# Patient Record
Sex: Male | Born: 1975 | Hispanic: Yes | Marital: Married | Smoking: Never smoker
Health system: Southern US, Community
[De-identification: ages and names within clinical notes are randomized; demographics above are authoritative.]

## PROBLEM LIST (undated history)

## (undated) DIAGNOSIS — Z789 Other specified health status: Secondary | ICD-10-CM

## (undated) HISTORY — PX: EYE SURGERY: SHX253

## (undated) HISTORY — PX: APPENDECTOMY: SHX54

---

## 2012-06-22 ENCOUNTER — Ambulatory Visit: Payer: Self-pay | Admitting: Family Medicine

## 2012-06-22 ENCOUNTER — Ambulatory Visit: Payer: Self-pay

## 2012-06-22 VITALS — BP 98/70 | HR 90 | Temp 98.3°F | Resp 18 | Ht 66.0 in | Wt 174.0 lb

## 2012-06-22 DIAGNOSIS — R1032 Left lower quadrant pain: Secondary | ICD-10-CM

## 2012-06-22 DIAGNOSIS — F524 Premature ejaculation: Secondary | ICD-10-CM

## 2012-06-22 LAB — POCT URINALYSIS DIPSTICK
Bilirubin, UA: NEGATIVE
Glucose, UA: NEGATIVE
Ketones, UA: NEGATIVE
Leukocytes, UA: NEGATIVE
Nitrite, UA: NEGATIVE
Protein, UA: NEGATIVE
Spec Grav, UA: 1.025
Urobilinogen, UA: 0.2
pH, UA: 5.5

## 2012-06-22 MED ORDER — SERTRALINE HCL 50 MG PO TABS: 50.0000 mg | ORAL_TABLET | Freq: Every day | ORAL | Status: DC

## 2012-06-22 MED ORDER — KETOPROFEN 50 MG PO CAPS: 50.0000 mg | ORAL_CAPSULE | Freq: Two times a day (BID) | ORAL | Status: DC | PRN

## 2012-06-22 NOTE — Progress Notes (Signed)
  Subjective:    Patient ID: Trevor Graves, male    DOB: 02/12/1976, 36 y.o.   MRN: [id number]  HPI Has experienced LLQ flank pain for 2 years. He has been to several different doctors where they did not do various tests. He experiences pain everyday, but it's not constant. He is fine during work, but after work he feels pain, expecially after taking off the work belt.  No family history of kidney stones  Work:  roofing  Review of Systems No fever, no dysuria, no emesis or nausea, no hematuria Positive for premature ejaculation    Objective:   Physical Exam  Chest:  Clear Heart: reg, no murmur Genitalia:  Normal with exception of small left testicle ROM of torso:  Normal, but he has pain with flexion, twisting or side bends Abdomen:  Normal palpation with exception of pain in LLQ, no masses or HSM    [facility] reading (PRIMARY) by  Dr. Milus Glazier:  KUB-. Results for orders placed in visit on 06/22/12  POCT URINALYSIS DIPSTICK      Component Value Range   Color, UA yellow     Clarity, UA clear     Glucose, UA neg     Bilirubin, UA neg     Ketones, UA neg     Spec Grav, UA 1.025     Blood, UA trace     pH, UA 5.5     Protein, UA neg     Urobilinogen, UA 0.2     Nitrite, UA neg     Leukocytes, UA Negative       Assessment & Plan:  Premature ejaculation Chronic left flank pain, consistent with abdominal oblique strain  1. LLQ pain  DG Abd 1 View, Comprehensive metabolic panel, POCT urinalysis dipstick, ketoprofen (ORUDIS) 50 MG capsule  2. Premature ejaculation  sertraline (ZOLOFT) 50 MG tablet

## 2012-06-22 NOTE — Patient Instructions (Addendum)
Distensión muscular  (Muscle Strain)   Un esguince muscular se produce cuando un músculo se estira más allá de su largo normal. Generalmente causa la ruptura de algunas fibras musculares. Es muy frecuente entre los atletas. Ocurre cuando se aplica una fuerza violenta y súbita sobre un músculo y éste se estira demasiado. En general, la recuperación de un esguince muscular demora entre 1 y 2 semanas. La curación completa se produce luego de 5 a 6 semanas.   INSTRUCCIONES PARA EL CUIDADO DOMICILIARIO  · Mientras se encuentre despierto, aplique hielo sobre el músculo dolorido durante los primeros 2 días luego de la lesión.  · Ponga el hielo en una bolsa plástica.  · Colóquese una toalla entre la piel y la bolsa de hielo.  · Deje el hielo durante 15 a 20 minutos por hora.  · No use el músculo que sufrió el esguince durante algunos días, hasta que no sienta más dolor.  · Puede vendarse la zona lesionada con una venda elástica para obtener mayor comodidad. Tenga cuidado de no ajustarla demasiado. Esto puede interferir con la circulación sanguínea o aumentar la hinchazón.  · Sólo tome medicamentos de venta libre o recetados para calmar el dolor, las molestias o bajar la fiebre según las indicaciones de su médico.  SOLICITE ATENCIÓN MÉDICA SI:  El dolor o la hinchazón en la zona afectada aumentan.  ESTÉ SEGURO QUE:   · Comprende las instrucciones para el alta médica.  · Controlará su enfermedad.  · Solicitará atención médica de inmediato según las indicaciones.  Document Released: 06/12/2005 Document Revised: 11/25/2011  [employer]® Patient Information ©2013 [employer].

## 2012-06-23 LAB — COMPREHENSIVE METABOLIC PANEL
ALT: 25 U/L (ref 0–53)
AST: 15 U/L (ref 0–37)
Albumin: 4.9 g/dL (ref 3.5–5.2)
Alkaline Phosphatase: 57 U/L (ref 39–117)
BUN: 11 mg/dL (ref 6–23)
CO2: 23 mEq/L (ref 19–32)
Calcium: 9.4 mg/dL (ref 8.4–10.5)
Chloride: 104 mEq/L (ref 96–112)
Creat: 0.93 mg/dL (ref 0.50–1.35)
Glucose, Bld: 83 mg/dL (ref 70–99)
Potassium: 4 mEq/L (ref 3.5–5.3)
Sodium: 138 mEq/L (ref 135–145)
Total Bilirubin: 0.6 mg/dL (ref 0.3–1.2)
Total Protein: 7.1 g/dL (ref 6.0–8.3)

## 2012-12-21 ENCOUNTER — Other Ambulatory Visit: Payer: Self-pay

## 2012-12-21 ENCOUNTER — Inpatient Hospital Stay (HOSPITAL_COMMUNITY)
Admission: EM | Admit: 2012-12-21 | Discharge: 2012-12-24 | DRG: 343 | Disposition: A | Discharge status: Home / Self Care | Payer: Medicaid Other | Attending: General Surgery | Admitting: General Surgery

## 2012-12-21 ENCOUNTER — Encounter (HOSPITAL_COMMUNITY): Admission: EM | Disposition: A | Discharge status: Home / Self Care | Payer: Self-pay | Source: Home / Self Care

## 2012-12-21 ENCOUNTER — Telehealth: Payer: Self-pay | Admitting: Radiology

## 2012-12-21 ENCOUNTER — Encounter (HOSPITAL_COMMUNITY): Payer: Self-pay | Admitting: Certified Registered"

## 2012-12-21 ENCOUNTER — Encounter (HOSPITAL_COMMUNITY): Payer: Self-pay | Admitting: *Deleted

## 2012-12-21 ENCOUNTER — Inpatient Hospital Stay (HOSPITAL_COMMUNITY): Payer: Medicaid Other | Admitting: Certified Registered"

## 2012-12-21 ENCOUNTER — Ambulatory Visit: Payer: Self-pay | Admitting: Family Medicine

## 2012-12-21 ENCOUNTER — Ambulatory Visit (HOSPITAL_COMMUNITY)
Admission: RE | Admit: 2012-12-21 | Discharge: 2012-12-21 | Disposition: A | Discharge status: Home / Self Care | Payer: Medicaid Other | Source: Ambulatory Visit | Attending: Family Medicine | Admitting: Family Medicine

## 2012-12-21 VITALS — BP 130/86 | HR 84 | Temp 98.3°F | Resp 16 | Ht 67.58 in | Wt 187.6 lb

## 2012-12-21 DIAGNOSIS — R1031 Right lower quadrant pain: Secondary | ICD-10-CM | POA: Insufficient documentation

## 2012-12-21 DIAGNOSIS — K3580 Unspecified acute appendicitis: Secondary | ICD-10-CM | POA: Insufficient documentation

## 2012-12-21 DIAGNOSIS — D72829 Elevated white blood cell count, unspecified: Secondary | ICD-10-CM | POA: Diagnosis present

## 2012-12-21 DIAGNOSIS — K3532 Acute appendicitis with perforation and localized peritonitis, without abscess: Secondary | ICD-10-CM

## 2012-12-21 HISTORY — DX: Other specified health status: Z78.9

## 2012-12-21 LAB — CBC
HCT: 44 % (ref 39.0–52.0)
Hemoglobin: 15.4 g/dL (ref 13.0–17.0)
MCH: 30.1 pg (ref 26.0–34.0)
MCHC: 35 g/dL (ref 30.0–36.0)
MCV: 85.9 fL (ref 78.0–100.0)
Platelets: 196 10*3/uL (ref 150–400)
RBC: 5.12 MIL/uL (ref 4.22–5.81)
RDW: 12.3 % (ref 11.5–15.5)
WBC: 12.6 10*3/uL — ABNORMAL HIGH (ref 4.0–10.5)

## 2012-12-21 LAB — BASIC METABOLIC PANEL
BUN: 8 mg/dL (ref 6–23)
CO2: 28 mEq/L (ref 19–32)
Calcium: 9.5 mg/dL (ref 8.4–10.5)
Chloride: 97 mEq/L (ref 96–112)
Creatinine, Ser: 0.86 mg/dL (ref 0.50–1.35)
GFR calc Af Amer: >90 mL/min (ref >90)
GFR calc non Af Amer: >90 mL/min (ref >90)
Glucose, Bld: 108 mg/dL — ABNORMAL HIGH (ref 70–99)
Potassium: 3.9 mEq/L (ref 3.5–5.1)
Sodium: 135 mEq/L (ref 135–145)

## 2012-12-21 LAB — POCT CBC
Hemoglobin: 15.1 g/dL (ref 14.1–18.1)
MCH, POC: 29.9 pg (ref 27–31.2)
MID (cbc): 0.7 (ref 0–0.9)
MPV: 7.7 fL (ref 0–99.8)
POC Granulocyte: 9.9 — AB (ref 2–6.9)
POC MID %: 6 %M (ref 0–12)
Platelet Count, POC: 256 10*3/uL (ref 142–424)
RBC: 5.05 M/uL (ref 4.69–6.13)

## 2012-12-21 SURGERY — APPENDECTOMY, LAPAROSCOPIC
Anesthesia: General

## 2012-12-21 MED ORDER — ONDANSETRON HCL 4 MG/2ML IJ SOLN
4.0000 mg | Freq: Once | INTRAMUSCULAR | Status: AC
  Administered 2012-12-21: 4 mg via INTRAVENOUS
  Filled 2012-12-21: qty 2

## 2012-12-21 MED ORDER — IOHEXOL 300 MG/ML  SOLN
50.0000 mL | Freq: Once | INTRAMUSCULAR | Status: AC | PRN
  Administered 2012-12-21: 50 mL via ORAL

## 2012-12-21 MED ORDER — SODIUM CHLORIDE 0.9 % IV BOLUS (SEPSIS)
1000.0000 mL | Freq: Once | INTRAVENOUS | Status: AC
  Administered 2012-12-21: 1000 mL via INTRAVENOUS

## 2012-12-21 MED ORDER — MORPHINE SULFATE 2 MG/ML IJ SOLN: 1.0000 mg | INTRAMUSCULAR | Status: DC | PRN

## 2012-12-21 MED ORDER — IOHEXOL 300 MG/ML  SOLN
100.0000 mL | Freq: Once | INTRAMUSCULAR | Status: AC | PRN
  Administered 2012-12-21: 100 mL via INTRAVENOUS

## 2012-12-21 MED ORDER — HYDROMORPHONE HCL PF 1 MG/ML IJ SOLN
1.0000 mg | Freq: Once | INTRAMUSCULAR | Status: AC
  Administered 2012-12-21: 1 mg via INTRAVENOUS
  Filled 2012-12-21: qty 1

## 2012-12-21 MED ORDER — LACTATED RINGERS IV SOLN
INTRAVENOUS | Status: DC
  Administered 2012-12-21: 22:00:00 via INTRAVENOUS

## 2012-12-21 MED ORDER — SODIUM CHLORIDE 0.9 % IV SOLN
1.0000 g | Freq: Once | INTRAVENOUS | Status: AC
  Administered 2012-12-21: 1 g via INTRAVENOUS
  Filled 2012-12-21: qty 1

## 2012-12-21 NOTE — ED Provider Notes (Signed)
History    37 year old male with abdominal pain. Gradual onset yesterday morning at approximately 0430. Was laying in bed when he began having pain in his epigastrium. Pain subsequently radiated to his umbilical region and then the right lower quadrant. This pain is constant. Worse with movement and palpation. Nausea. No v/d. No urinary complaints. No significant PMHx. No hx of abdominal surgery. Seen at [facility] and sent for CT. Referred to ED after had imaging significant for likely ruptured appendicitis.   CSN: [id number]  Arrival date & time 12/21/12  1914   First MD Initiated Contact with Patient 12/21/12 1958      Chief Complaint  Patient presents with  . Abdominal Pain    (Consider location/radiation/quality/duration/timing/severity/associated sxs/prior treatment) HPI  History reviewed. No pertinent past medical history.  Past Surgical History  Procedure Laterality Date  . Eye surgery      History reviewed. No pertinent family history.  History  Substance Use Topics  . Smoking status: Never Smoker   . Smokeless tobacco: Not on file  . Alcohol Use: No      Review of Systems  All systems reviewed and negative, other than as noted in HPI.   Allergies  Review of patient's allergies indicates no known allergies.  Home Medications  No current outpatient prescriptions on file.  BP 133/83  Pulse 101  Temp(Src) 98.4 F (36.9 C) (Oral)  Resp 18  SpO2 98%  Physical Exam  Nursing note and vitals reviewed. Constitutional: He appears well-developed and well-nourished. No distress.  HENT:  Head: Normocephalic and atraumatic.  Eyes: Conjunctivae are normal. Right eye exhibits no discharge. Left eye exhibits no discharge.  Neck: Neck supple.  Cardiovascular: Normal rate, regular rhythm and normal heart sounds.  Exam reveals no gallop and no friction rub.   No murmur heard. Pulmonary/Chest: Effort normal and breath sounds normal. No respiratory distress.  Abdominal:  Soft. He exhibits no distension. There is tenderness. There is guarding. There is no rebound.  Right lower quadrant tenderness with voluntary guarding. No rebound tenderness. No distention. No surgical scars noted.  Genitourinary:  No costovertebral angle tenderness.  Musculoskeletal: He exhibits no edema and no tenderness.  Neurological: He is alert.  Skin: Skin is warm and dry.  Psychiatric: He has a normal mood and affect. His behavior is normal. Thought content normal.    ED Course  Procedures (including critical care time)  Labs Reviewed  CBC - Abnormal; Notable for the following:    WBC 12.6 (*)    All other components within normal limits  BASIC METABOLIC PANEL   Ct Abdomen Pelvis W Contrast  12/21/2012  *RADIOLOGY REPORT*  Clinical Data: Worsening right lower quadrant abdominal pain.  CT ABDOMEN AND PELVIS WITH CONTRAST  Technique:  Multidetector CT imaging of the abdomen and pelvis was performed following the standard protocol during bolus administration of intravenous contrast.  Contrast: OMNIPAQUE IOHEXOL 300 MG/ML  SOLN, 50mL OMNIPAQUE IOHEXOL 300 MG/ML  SOLN  Comparison: None.  Findings: There is evidence of ruptured acute appendicitis with significant inflammatory changes present inferior to the cecum. Visualized proximal to mid appendix is thickened, measuring 12 mm in greatest diameter.  The distal appendix is lost in an inflammatory mass with stranding and small areas of fluid present. No dominant drainable abscess is identified.  There is no evidence of visible appendicolith.  No associated bowel obstruction.  The liver, gallbladder, pancreas, adrenal glands, spleen and kidneys are within normal limits.  No hernias are seen.  Bladder  is unremarkable.  No bony abnormalities.  IMPRESSION: Ruptured appendicitis with significant inflammatory changes and irregular fluid surrounding a distended appendix.  No focal discrete abscess is identified.   Original Report Authenticated  By: Irish Lack, M.D.      1. Ruptured appendicitis       MDM  37 year old male with abdominal pain. Imaging significant for appendicitis. Basic labs ordered. Antibiotics. Discussed with general surgery. Admission.        Raeford Razor, MD 12/21/12 2031

## 2012-12-21 NOTE — Telephone Encounter (Signed)
[facility] called again, about patient, he is still in radiology, have advised patient to go to [facility] emergency room, he has ruptured appendix. Liborio Nixon in radiology is trying to locate patient. I have advised her Dr Fredia Sorrow has called already, and he is going to locate patient for [place]. Patient may have been d/c already . Liborio Nixon will speak to Dr Fredia Sorrow, then locate patient. Emalie Mcwethy

## 2012-12-21 NOTE — Patient Instructions (Signed)
Please call the [name] office at [phone] after your surgery so that you can have help in paying for it since you do not have insurance.  Appendicitis Appendicitis is when the appendix is swollen (inflamed). The inflammation can lead to developing a hole (perforation) and a collection of pus (abscess). CAUSES  There is not always an obvious cause of appendicitis. Sometimes it is caused by an obstruction in the appendix. The obstruction can be caused by:  A small, hard, pea-sized ball of stool (fecalith).  Enlarged lymph glands in the appendix. SYMPTOMS   Pain around your belly button (navel) that moves toward your lower right belly (abdomen). The pain can become more severe and sharp as time passes.  Tenderness in the lower right abdomen. Pain gets worse if you cough or make a sudden movement.  Feeling sick to your stomach (nauseous).  Throwing up (vomiting).  Loss of appetite.  Fever.  Constipation.  Diarrhea.  Generally not feeling well. DIAGNOSIS   Physical exam.  Blood tests.  Urine test.  X-rays or a CT scan may confirm the diagnosis. TREATMENT  Once the diagnosis of appendicitis is made, the most common treatment is to remove the appendix as soon as possible. This procedure is called appendectomy. In an open appendectomy, a cut (incision) is made in the lower right abdomen and the appendix is removed. In a laparoscopic appendectomy, usually 3 small incisions are made. Long, thin instruments and a camera tube are used to remove the appendix. Most patients go home in 24 to 48 hours after appendectomy. In some situations, the appendix may have already perforated and an abscess may have formed. The abscess may have a "wall" around it as seen on a CT scan. In this case, a drain may be placed into the abscess to remove fluid, and you may be treated with antibiotic medicines that kill germs. The medicine is given through a tube in your vein (IV). Once  the abscess has resolved, it may or may not be necessary to have an appendectomy. You may need to stay in the hospital longer than 48 hours. Document Released: 09/02/2005 Document Revised: 03/03/2012 Document Reviewed: 11/28/2009 [facility] Patient Information 2013 [place], [place].

## 2012-12-21 NOTE — ED Notes (Addendum)
Talked with surgeon about patient's disposition to [facility]. Surgeon stated that he would like the patient to stay in the ED because there is just one patient ahead of the patient for surgery. Informed surgeon that patient had a bed available, however did not have any admission orders other than the admit order/bed request. Surgeon stated there is difficulty obtaining patients from the floor for surgery. No additional orders obtained.

## 2012-12-21 NOTE — ED Notes (Signed)
Report called to [facility]. Pt does not have temporary admit orders. Called Dr. Juleen China, who stated that he wanted to wait for surgery to evaluate patient prior to moving patient. Spoke with ED Charge nurse about situation in attempt to reduce pt time in department. ED Charge called house coverage who supported the EDP decision to keep patient in department.

## 2012-12-21 NOTE — Consult Note (Signed)
Reason for Consult:abdominal pain Referring Physician: [facility]  Trevor Graves is an 37 y.o. male.  HPI: I was asked to evaluate this patient for abdominal pain and possible ruptured appendicitis. He said that Saturday night when he went to bed he was feeling well without any complaints. He awoke Sunday morning about 4 am with epigastric and periumbilical pain. He said that this continued to get worse throughout the day and eventually migrated to the right lower quadrant. He says that the pain has been getting worse and he cannot lay down flat without exacerbating the pain. He is not anorexic but does have some chills. He has not had any nausea or vomiting today but yesterday did vomit. He denies any urinary symptoms. Denies any problems with his bowels  Past Medical History  Diagnosis Date  . Medical history non-contributory   None  Past Surgical History  Procedure Laterality Date  . Eye surgery    Lasik  History reviewed. No pertinent family history.  Social History:  reports that he has never smoked. He has never used smokeless tobacco. He reports that  drinks alcohol. He reports that he does not use illicit drugs.  Allergies: No Known Allergies  Medications: I have reviewed the patient's current medications.  Results for orders placed during the hospital encounter of 12/21/12 (from the past 48 hour(s))  BASIC METABOLIC PANEL     Status: Abnormal   Collection Time    12/21/12  8:03 PM      Result Value Range   Sodium 135  135 - 145 mEq/L   Potassium 3.9  3.5 - 5.1 mEq/L   Chloride 97  96 - 112 mEq/L   CO2 28  19 - 32 mEq/L   Glucose, Bld 108 (*) 70 - 99 mg/dL   BUN 8  6 - 23 mg/dL   Creatinine, Ser 9.60  0.50 - 1.35 mg/dL   Calcium 9.5  8.4 - 45.4 mg/dL   GFR calc non Af Amer >90  >90 mL/min   GFR calc Af Amer >90  >90 mL/min   Comment:            The eGFR has been calculated     using the CKD EPI equation.     This calculation has not been     validated in all  clinical     situations.     eGFR's persistently     <90 mL/min signify     possible Chronic Kidney Disease.  CBC     Status: Abnormal   Collection Time    12/21/12  8:03 PM      Result Value Range   WBC 12.6 (*) 4.0 - 10.5 K/uL   RBC 5.12  4.22 - 5.81 MIL/uL   Hemoglobin 15.4  13.0 - 17.0 g/dL   HCT 09.8  11.9 - 14.7 %   MCV 85.9  78.0 - 100.0 fL   MCH 30.1  26.0 - 34.0 pg   MCHC 35.0  30.0 - 36.0 g/dL   RDW 82.9  56.2 - 13.0 %   Platelets 196  150 - 400 K/uL    Ct Abdomen Pelvis W Contrast  12/21/2012  *RADIOLOGY REPORT*  Clinical Data: Worsening right lower quadrant abdominal pain.  CT ABDOMEN AND PELVIS WITH CONTRAST  Technique:  Multidetector CT imaging of the abdomen and pelvis was performed following the standard protocol during bolus administration of intravenous contrast.  Contrast: OMNIPAQUE IOHEXOL 300 MG/ML  SOLN, 50mL OMNIPAQUE IOHEXOL  300 MG/ML  SOLN  Comparison: None.  Findings: There is evidence of ruptured acute appendicitis with significant inflammatory changes present inferior to the cecum. Visualized proximal to mid appendix is thickened, measuring 12 mm in greatest diameter.  The distal appendix is lost in an inflammatory mass with stranding and small areas of fluid present. No dominant drainable abscess is identified.  There is no evidence of visible appendicolith.  No associated bowel obstruction.  The liver, gallbladder, pancreas, adrenal glands, spleen and kidneys are within normal limits.  No hernias are seen.  Bladder is unremarkable.  No bony abnormalities.  IMPRESSION: Ruptured appendicitis with significant inflammatory changes and irregular fluid surrounding a distended appendix.  No focal discrete abscess is identified.   Original Report Authenticated By: Irish Lack, M.D.     All other review of systems negative or noncontributory except as stated in the HPI   Blood pressure 133/83, pulse 101, temperature 98.4 F (36.9 C), temperature source Oral,  resp. rate 18, SpO2 98.00%. General appearance: alert, cooperative and no distress Head: Normocephalic, without obvious abnormality, atraumatic Neck: no JVD and supple, symmetrical, trachea midline Resp: nonlabored Cardio: normal rate on my exam, regular GI: soft, right sided tenderness and suprapubic tenderness, greatest in RLQ, with voluntary guarding, ND Extremities: extremities normal, atraumatic, no cyanosis or edema Pulses: 2+ and symmetric Skin: Skin color, texture, turgor normal. No rashes or lesions Neurologic: Grossly normal  Assessment/Plan: Abdominal pain, likely acute appendicitis His history and physical exam are most consistent with acute appendicitis. He does have an elevated white blood cell count and a CT scan concerning for a ruptured appendicitis. I discussed with him the findings and discuss with him the options for antibiotics versus surgery and I have recommended diagnostic laparoscopy with appendectomy and possible open surgery and possible bowel resection. I discussed with him the risks of infection, bleeding, pain, scarring, need for open surgery, and possible bowel resection, injury to ureters and postoperative abscess and ileus and he expressed understanding and would like to proceed with diagnostic laparoscopy and appendectomy and possible open surgery  Trevor Graves [name] 12/21/2012, 9:44 PM

## 2012-12-21 NOTE — ED Notes (Signed)
Pt comes from home with c/o RLQ pain. Pt went to [place] urgent care and was told to come to Radiology. Pt was then sent from Radiology to ER. Pt was told at [facility] he has acute appendicitis.

## 2012-12-21 NOTE — Telephone Encounter (Signed)
Radiology called with report of CT scan. Advised her Dr Fredia Sorrow has already called and spoken to Dr Clelia Croft. Patient has ruptured appendix, he is being sent to [facility] long hospital

## 2012-12-21 NOTE — Progress Notes (Signed)
Subjective:    Patient ID: Trevor Graves, male    DOB: 1975/11/29, 37 y.o.   MRN: [id number] Chief Complaint  Patient presents with  . Abdominal Pain    x 3 days   constant    HPI  Has periumbilitcal and RLQ pain - initially started in epigastric but moved. Woke him from sleep 12 hours ago.  Feels little mass and very tender to touch in RLQ. He has been drinking but after a few sips, no appetite. Ate dinner last night and breakfast today but no appetite - feels like something is stuck. When he walks, feels like he is going to explode. Really hurts when he flexes his right hip. No nausea. No BM today which is abnormal. Has never had any abdominal surgeries.   History reviewed. No pertinent past medical history. Past Surgical History  Procedure Laterality Date  . Eye surgery       Review of Systems  Constitutional: Positive for chills, diaphoresis, activity change and appetite change. Negative for fever, fatigue and unexpected weight change.  Respiratory: Negative for shortness of breath.   Cardiovascular: Negative for chest pain.  Gastrointestinal: Positive for nausea, abdominal pain, constipation and abdominal distention. Negative for vomiting, diarrhea, blood in stool, anal bleeding and rectal pain.  Genitourinary: Positive for decreased urine volume. Negative for dysuria and frequency.  Hematological: Negative for adenopathy.  Psychiatric/Behavioral: Positive for sleep disturbance.      BP 130/86  Pulse 84  Temp(Src) 98.3 F (36.8 C) (Oral)  Resp 16  Ht 5' 7.58" (1.717 m)  Wt 187 lb 9.6 oz (85.095 kg)  BMI 28.86 kg/m2  SpO2 99% Objective:   Physical Exam  Constitutional: He appears well-developed and well-nourished. No distress.  HENT:  Head: Normocephalic and atraumatic.  Neck: Normal range of motion. Neck supple. No thyromegaly present.  Cardiovascular: Normal rate, regular rhythm and normal heart sounds.   Pulmonary/Chest: Effort normal and breath sounds normal. No  respiratory distress.  Abdominal: Soft. Normal appearance. He exhibits no distension and no mass. Bowel sounds are decreased. There is no hepatosplenomegaly. There is tenderness in the right lower quadrant. There is rebound, guarding and tenderness at McBurney's point. There is no rigidity and no CVA tenderness. No hernia.  Pt w/ referred pain to RLQ w/ palpation of LLQ, has severe pain with hip flexion, has rebound tenderness  Genitourinary: Rectum normal and prostate normal. Rectal exam shows no tenderness and anal tone normal. Guaiac negative stool.  Lymphadenopathy:    He has no cervical adenopathy.  Skin: He is not diaphoretic.       Results for orders placed in visit on 12/21/12  POCT CBC      Result Value Range   WBC 12.5 (*) 4.6 - 10.2 K/uL   Lymph, poc 1.8  0.6 - 3.4   POC LYMPH PERCENT 14.6  10 - 50 %L   MID (cbc) 0.7  0 - 0.9   POC MID % 6.0  0 - 12 %M   POC Granulocyte 9.9 (*) 2 - 6.9   Granulocyte percent 79.4  37 - 80 %G   RBC 5.05  4.69 - 6.13 M/uL   Hemoglobin 15.1  14.1 - 18.1 g/dL   HCT, POC 40.9  81.1 - 53.7 %   MCV 90.2  80 - 97 fL   MCH, POC 29.9  27 - 31.2 pg   MCHC 33.1  31.8 - 35.4 g/dL   RDW, POC 91.4     Platelet Count, POC  256  142 - 424 K/uL   MPV 7.7  0 - 99.8 fL    Assessment & Plan:  Abdominal pain, right lower quadrant - Plan: POCT CBC, CT Abdomen Pelvis W Contrast  Appendicitis, acute  Lortab elixir 15mL given in office po x 1 - pt will call and have someone take him over to [facility] for his stat CT scan for suspected acute appendicitis.

## 2012-12-22 ENCOUNTER — Encounter (HOSPITAL_COMMUNITY): Payer: Self-pay | Admitting: Certified Registered"

## 2012-12-22 ENCOUNTER — Inpatient Hospital Stay (HOSPITAL_COMMUNITY): Payer: Medicaid Other | Admitting: Certified Registered"

## 2012-12-22 ENCOUNTER — Encounter (HOSPITAL_COMMUNITY): Admission: EM | Disposition: A | Discharge status: Home / Self Care | Payer: Self-pay | Source: Home / Self Care

## 2012-12-22 HISTORY — PX: LAPAROSCOPIC APPENDECTOMY: SHX408

## 2012-12-22 SURGERY — APPENDECTOMY, LAPAROSCOPIC
Anesthesia: General | Site: Abdomen | Wound class: Contaminated

## 2012-12-22 MED ORDER — LIDOCAINE-EPINEPHRINE 1 %-1:100000 IJ SOLN
INTRAMUSCULAR | Status: AC
  Filled 2012-12-22: qty 1

## 2012-12-22 MED ORDER — NEOSTIGMINE METHYLSULFATE 1 MG/ML IJ SOLN
INTRAMUSCULAR | Status: DC | PRN
  Administered 2012-12-22: 3 mg via INTRAVENOUS

## 2012-12-22 MED ORDER — MORPHINE SULFATE 2 MG/ML IJ SOLN
1.0000 mg | INTRAMUSCULAR | Status: DC | PRN
  Administered 2012-12-22: 2 mg via INTRAVENOUS
  Filled 2012-12-22: qty 1

## 2012-12-22 MED ORDER — ROCURONIUM BROMIDE 100 MG/10ML IV SOLN
INTRAVENOUS | Status: DC | PRN
  Administered 2012-12-22: 30 mg via INTRAVENOUS
  Administered 2012-12-22: 10 mg via INTRAVENOUS

## 2012-12-22 MED ORDER — MIDAZOLAM HCL 5 MG/5ML IJ SOLN
INTRAMUSCULAR | Status: DC | PRN
  Administered 2012-12-22: 2 mg via INTRAVENOUS

## 2012-12-22 MED ORDER — GLYCOPYRROLATE 0.2 MG/ML IJ SOLN
INTRAMUSCULAR | Status: DC | PRN
  Administered 2012-12-22: .4 mg via INTRAVENOUS

## 2012-12-22 MED ORDER — LIDOCAINE HCL (PF) 2 % IJ SOLN
INTRAMUSCULAR | Status: DC | PRN
  Administered 2012-12-22: 20 mg

## 2012-12-22 MED ORDER — KCL IN DEXTROSE-NACL 20-5-0.45 MEQ/L-%-% IV SOLN
INTRAVENOUS | Status: DC
  Administered 2012-12-22 (×3): via INTRAVENOUS
  Administered 2012-12-23: 125 mL/h via INTRAVENOUS
  Filled 2012-12-22 (×9): qty 1000

## 2012-12-22 MED ORDER — BUPIVACAINE HCL (PF) 0.25 % IJ SOLN
INTRAMUSCULAR | Status: AC
  Filled 2012-12-22: qty 30

## 2012-12-22 MED ORDER — LIDOCAINE-EPINEPHRINE 1 %-1:100000 IJ SOLN
INTRAMUSCULAR | Status: DC | PRN
  Administered 2012-12-22: 15 mL

## 2012-12-22 MED ORDER — 0.9 % SODIUM CHLORIDE (POUR BTL) OPTIME
TOPICAL | Status: DC | PRN
  Administered 2012-12-22: 1000 mL

## 2012-12-22 MED ORDER — ERTAPENEM SODIUM 1 G IJ SOLR
1.0000 g | INTRAMUSCULAR | Status: AC
  Administered 2012-12-22: 1 g via INTRAVENOUS
  Filled 2012-12-22: qty 1

## 2012-12-22 MED ORDER — PROPOFOL 10 MG/ML IV BOLUS
INTRAVENOUS | Status: DC | PRN
  Administered 2012-12-22: 150 mg via INTRAVENOUS

## 2012-12-22 MED ORDER — FENTANYL CITRATE 0.05 MG/ML IJ SOLN
INTRAMUSCULAR | Status: DC | PRN
  Administered 2012-12-22 (×2): 50 ug via INTRAVENOUS
  Administered 2012-12-22: 100 ug via INTRAVENOUS

## 2012-12-22 MED ORDER — HYDROMORPHONE HCL PF 1 MG/ML IJ SOLN: 0.2500 mg | INTRAMUSCULAR | Status: DC | PRN

## 2012-12-22 MED ORDER — ACETAMINOPHEN 10 MG/ML IV SOLN
INTRAVENOUS | Status: DC | PRN
  Administered 2012-12-22: 1000 mg via INTRAVENOUS

## 2012-12-22 MED ORDER — HYDROCODONE-ACETAMINOPHEN 5-325 MG PO TABS
1.0000 | ORAL_TABLET | ORAL | Status: DC | PRN
  Administered 2012-12-22 – 2012-12-24 (×6): 2 via ORAL
  Filled 2012-12-22 (×6): qty 2

## 2012-12-22 MED ORDER — PROMETHAZINE HCL 25 MG/ML IJ SOLN: 6.2500 mg | INTRAMUSCULAR | Status: DC | PRN

## 2012-12-22 MED ORDER — ONDANSETRON HCL 4 MG/2ML IJ SOLN: 4.0000 mg | Freq: Four times a day (QID) | INTRAMUSCULAR | Status: DC | PRN

## 2012-12-22 MED ORDER — BUPIVACAINE HCL 0.25 % IJ SOLN
INTRAMUSCULAR | Status: DC | PRN
  Administered 2012-12-22: 15 mL

## 2012-12-22 MED ORDER — ONDANSETRON HCL 4 MG/2ML IJ SOLN
INTRAMUSCULAR | Status: DC | PRN
  Administered 2012-12-22: 4 mg via INTRAVENOUS

## 2012-12-22 MED ORDER — ACETAMINOPHEN 10 MG/ML IV SOLN
INTRAVENOUS | Status: AC
  Filled 2012-12-22: qty 100

## 2012-12-22 MED ORDER — ENOXAPARIN SODIUM 40 MG/0.4ML ~~LOC~~ SOLN
40.0000 mg | SUBCUTANEOUS | Status: DC
  Administered 2012-12-22 – 2012-12-23 (×2): 40 mg via SUBCUTANEOUS
  Filled 2012-12-22 (×3): qty 0.4

## 2012-12-22 MED ORDER — LACTATED RINGERS IR SOLN
Status: DC | PRN
  Administered 2012-12-22: 1000 mL

## 2012-12-22 MED ORDER — SUCCINYLCHOLINE CHLORIDE 20 MG/ML IJ SOLN
INTRAMUSCULAR | Status: DC | PRN
  Administered 2012-12-22: 140 mg via INTRAVENOUS

## 2012-12-22 MED ORDER — ONDANSETRON HCL 4 MG PO TABS: 4.0000 mg | ORAL_TABLET | Freq: Four times a day (QID) | ORAL | Status: DC | PRN

## 2012-12-22 SURGICAL SUPPLY — 38 items
APPLIER CLIP ROT 10 11.4 M/L (STAPLE)
CANISTER SUCTION 2500CC (MISCELLANEOUS) ×2 IMPLANT
CHLORAPREP W/TINT 26ML (MISCELLANEOUS) ×2 IMPLANT
CLIP APPLIE ROT 10 11.4 M/L (STAPLE) IMPLANT
CLOTH BEACON ORANGE TIMEOUT ST (SAFETY) IMPLANT
COVER SURGICAL LIGHT HANDLE (MISCELLANEOUS) ×2 IMPLANT
CUTTER FLEX LINEAR 45M (STAPLE) ×4 IMPLANT
DECANTER SPIKE VIAL GLASS SM (MISCELLANEOUS) ×6 IMPLANT
DERMABOND ADVANCED (GAUZE/BANDAGES/DRESSINGS) ×1
DERMABOND ADVANCED .7 DNX12 (GAUZE/BANDAGES/DRESSINGS) ×1 IMPLANT
ELECT REM PT RETURN 9FT ADLT (ELECTROSURGICAL) ×2
ELECTRODE REM PT RTRN 9FT ADLT (ELECTROSURGICAL) ×1 IMPLANT
GLOVE SURG SS PI 7.5 STRL IVOR (GLOVE) ×4 IMPLANT
GOWN STRL NON-REIN LRG LVL3 (GOWN DISPOSABLE) ×4 IMPLANT
GOWN STRL REIN XL XLG (GOWN DISPOSABLE) ×2 IMPLANT
HANDLE STAPLE EGIA 4 XL (STAPLE) ×2 IMPLANT
HANDLE STAPLE ENDO GIA SHORT (STAPLE) ×1
KIT BASIN OR (CUSTOM PROCEDURE TRAY) ×2 IMPLANT
NS IRRIG 1000ML POUR BTL (IV SOLUTION) ×2 IMPLANT
POUCH SPECIMEN RETRIEVAL 10MM (ENDOMECHANICALS) ×2 IMPLANT
RELOAD 45 VASCULAR/THIN (ENDOMECHANICALS) IMPLANT
RELOAD EGIA 45 MED/THCK PURPLE (STAPLE) ×4 IMPLANT
RELOAD EGIA 45 TAN VASC (STAPLE) ×2 IMPLANT
RELOAD STAPLE TA45 3.5 REG BLU (ENDOMECHANICALS) IMPLANT
SCALPEL HARMONIC ACE (MISCELLANEOUS) ×2 IMPLANT
SCISSORS LAP 5X35 DISP (ENDOMECHANICALS) IMPLANT
SET IRRIG TUBING LAPAROSCOPIC (IRRIGATION / IRRIGATOR) ×2 IMPLANT
SLEEVE ENDOPATH XCEL 5M (ENDOMECHANICALS) ×2 IMPLANT
STAPLER ENDO GIA 12MM SHORT (STAPLE) ×1 IMPLANT
SUT MNCRL AB 4-0 PS2 18 (SUTURE) ×2 IMPLANT
SUT VICRYL 0 UR6 27IN ABS (SUTURE) ×2 IMPLANT
TOWEL OR 17X24 6PK STRL BLUE (TOWEL DISPOSABLE) ×2 IMPLANT
TOWEL OR 17X26 10 PK STRL BLUE (TOWEL DISPOSABLE) ×2 IMPLANT
TRAY FOLEY CATH 14FR (SET/KITS/TRAYS/PACK) ×2 IMPLANT
TROCAR BLADELESS OPT 5 75 (ENDOMECHANICALS) ×2 IMPLANT
TROCAR XCEL BLUNT TIP 100MML (ENDOMECHANICALS) ×2 IMPLANT
TROCAR XCEL NON-BLD 5MMX100MML (ENDOMECHANICALS) ×2 IMPLANT
WATER STERILE IRR 1000ML POUR (IV SOLUTION) IMPLANT

## 2012-12-22 NOTE — Anesthesia Preprocedure Evaluation (Signed)
Anesthesia Evaluation  Patient identified by MRN, date of birth, ID band Patient awake    Reviewed: Allergy & Precautions, H&P , NPO status , Patient's Chart, lab work & pertinent test results, reviewed documented beta blocker date and time   Airway Mallampati: II TM Distance: >3 FB Neck ROM: full    Dental no notable dental hx.    Pulmonary neg pulmonary ROS,  breath sounds clear to auscultation  Pulmonary exam normal       Cardiovascular Exercise Tolerance: Good negative cardio ROS  Rhythm:regular Rate:Normal     Neuro/Psych negative neurological ROS  negative psych ROS   GI/Hepatic negative GI ROS, Neg liver ROS,   Endo/Other  negative endocrine ROS  Renal/GU negative Renal ROS  negative genitourinary   Musculoskeletal   Abdominal   Peds  Hematology negative hematology ROS (+)   Anesthesia Other Findings   Reproductive/Obstetrics negative OB ROS                           Anesthesia Physical Anesthesia Plan  ASA: I and emergent  Anesthesia Plan: General   Post-op Pain Management:    Induction:   Airway Management Planned: Oral ETT  Additional Equipment:   Intra-op Plan:   Post-operative Plan:   Informed Consent: I have reviewed the patients History and Physical, chart, labs and discussed the procedure including the risks, benefits and alternatives for the proposed anesthesia with the patient or authorized representative who has indicated his/her understanding and acceptance.   Dental Advisory Given  Plan Discussed with: CRNA  Anesthesia Plan Comments:         Anesthesia Quick Evaluation

## 2012-12-22 NOTE — Anesthesia Preprocedure Evaluation (Signed)
Anesthesia Evaluation  Patient identified by MRN, date of birth, ID band Patient awake    Reviewed: Allergy & Precautions, H&P , NPO status , Patient's Chart, lab work & pertinent test results, reviewed documented beta blocker date and time   Airway Mallampati: II TM Distance: >3 FB Neck ROM: full    Dental no notable dental hx.    Pulmonary neg pulmonary ROS,  breath sounds clear to auscultation  Pulmonary exam normal       Cardiovascular Exercise Tolerance: Good negative cardio ROS  Rhythm:regular Rate:Normal     Neuro/Psych negative neurological ROS  negative psych ROS   GI/Hepatic negative GI ROS, Neg liver ROS,   Endo/Other  negative endocrine ROS  Renal/GU negative Renal ROS  negative genitourinary   Musculoskeletal   Abdominal   Peds  Hematology negative hematology ROS (+)   Anesthesia Other Findings   Reproductive/Obstetrics negative OB ROS                           Anesthesia Physical Anesthesia Plan  ASA: I and emergent  Anesthesia Plan: General   Post-op Pain Management:    Induction:   Airway Management Planned: Oral ETT  Additional Equipment:   Intra-op Plan:   Post-operative Plan:   Informed Consent: I have reviewed the patients History and Physical, chart, labs and discussed the procedure including the risks, benefits and alternatives for the proposed anesthesia with the patient or authorized representative who has indicated his/her understanding and acceptance.   Dental Advisory Given  Plan Discussed with: CRNA  Anesthesia Plan Comments:         Anesthesia Quick Evaluation  

## 2012-12-22 NOTE — Transfer of Care (Signed)
Immediate Anesthesia Transfer of Care Note  Patient: [facility]  Procedure(s) Performed: Procedure(s): APPENDECTOMY LAPAROSCOPIC (N/A)  Patient Location: PACU  Anesthesia Type:General  Level of Consciousness: awake, alert , oriented and patient cooperative  Airway & Oxygen Therapy: Patient Spontanous Breathing and Patient connected to face mask oxygen  Post-op Assessment: Report given to PACU RN and Post -op Vital signs reviewed and stable  Post vital signs: Reviewed and stable  Complications: No apparent anesthesia complications

## 2012-12-22 NOTE — Progress Notes (Signed)
Patient seen and examined.  Agree with PA's note.  

## 2012-12-22 NOTE — Brief Op Note (Signed)
12/21/2012 - 12/22/2012  3:31 AM  PATIENT:  Trevor Graves  37 y.o. male  PRE-OPERATIVE DIAGNOSIS:  appendicitis  POST-OPERATIVE DIAGNOSIS:  appendicitis  PROCEDURE:  Procedure(s): APPENDECTOMY LAPAROSCOPIC (N/A)  SURGEON:  Surgeon(s) and Role:    * Lodema Pilot, DO - Primary  PHYSICIAN ASSISTANT:   ASSISTANTS: none   ANESTHESIA:   general  EBL:  Total I/O In: -  Out: 125 [Urine:125]  BLOOD ADMINISTERED:none  DRAINS: none   LOCAL MEDICATIONS USED:  MARCAINE    and LIDOCAINE   SPECIMEN:  Source of Specimen:  appendix  DISPOSITION OF SPECIMEN:  PATHOLOGY  COUNTS:  YES  TOURNIQUET:  * No tourniquets in log *  DICTATION: .Other Dictation: Dictation Number dictated [id number]  PLAN OF CARE: Admit to inpatient   PATIENT DISPOSITION:  PACU - hemodynamically stable.   Delay start of Pharmacological VTE agent (>24hrs) due to surgical blood loss or risk of bleeding: no

## 2012-12-22 NOTE — Progress Notes (Signed)
Patient ID: Trevor Graves, male   DOB: 1976/04/05, 37 y.o.   MRN: [id number] Day of Surgery  Subjective: Pt sore but no severe pain, tolerating diet, denies n/v  Objective: Vital signs in last 24 hours: Temp:  [98 F (36.7 C)-99.3 F (37.4 C)] 98.1 F (36.7 C) (04/08 0715) Pulse Rate:  [76-109] 93 (04/08 0715) Resp:  [16-20] 18 (04/08 0715) BP: (109-142)/(54-86) 109/71 mmHg (04/08 0715) SpO2:  [97 %-100 %] 98 % (04/08 0715) Weight:  [187 lb 9.6 oz (85.095 kg)-194 lb (87.998 kg)] 194 lb (87.998 kg) (04/08 0515) Last BM Date:  (pta)  Intake/Output from previous day: 04/07 0701 - 04/08 0700 In: 1197.9 [I.V.:1197.9] Out: 125 [Urine:125] Intake/Output this shift:    PE: Abd: soft, expected tenderness over incisions, incisions c.d.i, +bs General: awake, NAD  Lab Results:   Recent Labs  12/21/12 1713 12/21/12 2003  WBC 12.5* 12.6*  HGB 15.1 15.4  HCT 45.6 44.0  PLT  --  196   BMET  Recent Labs  12/21/12 2003  NA 135  K 3.9  CL 97  CO2 28  GLUCOSE 108*  BUN 8  CREATININE 0.86  CALCIUM 9.5   PT/INR No results found for this basename: LABPROT, INR,  in the last 72 hours CMP     Component Value Date/Time   NA 135 12/21/2012 2003   K 3.9 12/21/2012 2003   CL 97 12/21/2012 2003   CO2 28 12/21/2012 2003   GLUCOSE 108* 12/21/2012 2003   BUN 8 12/21/2012 2003   CREATININE 0.86 12/21/2012 2003   CREATININE 0.93 06/22/2012 1641   CALCIUM 9.5 12/21/2012 2003   PROT 7.1 06/22/2012 1641   ALBUMIN 4.9 06/22/2012 1641   AST 15 06/22/2012 1641   ALT 25 06/22/2012 1641   ALKPHOS 57 06/22/2012 1641   BILITOT 0.6 06/22/2012 1641   GFRNONAA >90 12/21/2012 2003   GFRAA >90 12/21/2012 2003   Lipase  No results found for this basename: lipase       Studies/Results: Ct Abdomen Pelvis W Contrast  12/21/2012  *RADIOLOGY REPORT*  Clinical Data: Worsening right lower quadrant abdominal pain.  CT ABDOMEN AND PELVIS WITH CONTRAST  Technique:  Multidetector CT imaging of the abdomen and pelvis was  performed following the standard protocol during bolus administration of intravenous contrast.  Contrast: OMNIPAQUE IOHEXOL 300 MG/ML  SOLN, 50mL OMNIPAQUE IOHEXOL 300 MG/ML  SOLN  Comparison: None.  Findings: There is evidence of ruptured acute appendicitis with significant inflammatory changes present inferior to the cecum. Visualized proximal to mid appendix is thickened, measuring 12 mm in greatest diameter.  The distal appendix is lost in an inflammatory mass with stranding and small areas of fluid present. No dominant drainable abscess is identified.  There is no evidence of visible appendicolith.  No associated bowel obstruction.  The liver, gallbladder, pancreas, adrenal glands, spleen and kidneys are within normal limits.  No hernias are seen.  Bladder is unremarkable.  No bony abnormalities.  IMPRESSION: Ruptured appendicitis with significant inflammatory changes and irregular fluid surrounding a distended appendix.  No focal discrete abscess is identified.   Original Report Authenticated By: Irish Lack, M.D.     Anti-infectives: Anti-infectives   Start     Dose/Rate Route Frequency Ordered Stop   12/22/12 2000  ertapenem (INVANZ) 1 g in sodium chloride 0.9 % 50 mL IVPB     1 g 100 mL/hr over 30 Minutes Intravenous Every 24 hours 12/22/12 0440 12/23/12 1959   12/21/12 2000  ertapenem (INVANZ) 1 g in sodium chloride 0.9 % 50 mL IVPB     1 g 100 mL/hr over 30 Minutes Intravenous  Once 12/21/12 1959 12/21/12 2121       Assessment/Plan 1. POD#1-lap appy: doing good, appendix was bad but not perforated, will keep here today on IV abx and plan d/c home tomorrow if no problems.  OOB, diet as tolerated   LOS: 1 day    Nayra Coury 12/22/2012

## 2012-12-22 NOTE — Anesthesia Postprocedure Evaluation (Signed)
  Anesthesia Post-op Note  Patient: Trevor Graves  Procedure(s) Performed: Procedure(s) (LRB): APPENDECTOMY LAPAROSCOPIC (N/A)  Patient Location: PACU  Anesthesia Type: General  Level of Consciousness: awake and alert   Airway and Oxygen Therapy: Patient Spontanous Breathing  Post-op Pain: mild  Post-op Assessment: Post-op Vital signs reviewed, Patient's Cardiovascular Status Stable, Respiratory Function Stable, Patent Airway and No signs of Nausea or vomiting  Last Vitals:  Filed Vitals:   12/22/12 0359  BP: 118/66  Pulse: 79  Temp:   Resp: 16    Post-op Vital Signs: stable   Complications: No apparent anesthesia complications

## 2012-12-22 NOTE — Care Management Note (Signed)
    Page 1 of 1   12/22/2012     12:35:53 PM   CARE MANAGEMENT NOTE 12/22/2012  Patient:  Trevor Graves   Account Number:  [id number]  Date Initiated:  12/22/2012  Documentation initiated by:  Lorenda Ishihara  Subjective/Objective Assessment:   37 yo male admitted s/p lap appy. PTA lived at home with spouse.     Action/Plan:   home when stable   Anticipated DC Date:  12/24/2012   Anticipated DC Plan:  HOME/SELF CARE      DC Planning Services  CM consult      Choice offered to / List presented to:             Status of service:  Completed, signed off Medicare Important Message given?   (If response is "NO", the following Medicare IM given date fields will be blank) Date Medicare IM given:   Date Additional Medicare IM given:    Discharge Disposition:  HOME/SELF CARE  Per UR Regulation:  Reviewed for med. necessity/level of care/duration of stay  If discussed at Long Length of Stay Meetings, dates discussed:    Comments:

## 2012-12-23 ENCOUNTER — Encounter (HOSPITAL_COMMUNITY): Payer: Self-pay | Admitting: General Surgery

## 2012-12-23 LAB — CBC
Hemoglobin: 13.1 g/dL (ref 13.0–17.0)
Platelets: 186 10*3/uL (ref 150–400)
RBC: 4.28 MIL/uL (ref 4.22–5.81)
WBC: 7.5 10*3/uL (ref 4.0–10.5)

## 2012-12-23 MED ORDER — HYDROCODONE-ACETAMINOPHEN 5-325 MG PO TABS: 1.0000 | ORAL_TABLET | ORAL | Status: DC | PRN

## 2012-12-23 MED ORDER — PIPERACILLIN-TAZOBACTAM 3.375 G IVPB
3.3750 g | Freq: Three times a day (TID) | INTRAVENOUS | Status: DC
  Administered 2012-12-23 – 2012-12-24 (×4): 3.375 g via INTRAVENOUS
  Filled 2012-12-23 (×5): qty 50

## 2012-12-23 MED ORDER — AMOXICILLIN-POT CLAVULANATE 875-125 MG PO TABS: 1.0000 | ORAL_TABLET | Freq: Two times a day (BID) | ORAL | Status: DC

## 2012-12-23 MED ORDER — PIPERACILLIN-TAZOBACTAM 3.375 G IVPB
3.3750 g | Freq: Three times a day (TID) | INTRAVENOUS | Status: DC
  Filled 2012-12-23: qty 50

## 2012-12-23 NOTE — Op Note (Signed)
Trevor Graves, Trevor Graves NO.:  [id number]  MEDICAL RECORD NO.:  [id number]  LOCATION:  1536                         FACILITY:  [facility]  PHYSICIAN:  Lodema Pilot, MD       DATE OF BIRTH:  Nov 05, 1975  DATE OF PROCEDURE:  12/22/2012 DATE OF DISCHARGE:                              OPERATIVE REPORT   PROCEDURE:  Laparoscopic appendectomy.  PREOPERATIVE DIAGNOSIS:  Acute appendicitis.  POSTOPERATIVE DIAGNOSIS:  Acute appendicitis.  SURGEON:  Lodema Pilot, MD  ASSISTANT:  None.  ANESTHESIA:  General endotracheal anesthesia with 30 mL of 1% lidocaine with epinephrine and 0.25% Marcaine in a 50:50 mixture.  FLUIDS:  1 L crystalloid.  ESTIMATED BLOOD LOSS:  Minimal.  DRAINS:  None.  SPECIMENS:  Appendix sent to Pathology for permanent sectioning.  COMPLICATIONS:  None apparent.  FINDINGS:  Acute appendicitis with necrotic appendix.  No obvious purulence or free perforation.  Healthy-appearing base.  INDICATION FOR PROCEDURE:  Trevor Graves is a 37 year old male with 1-1/2 day history of periumbilical pain, which is located to the right lower quadrant.  He has a physical exam concerning for acute appendicitis as well as leukocytosis and CT scan concerning for perforated appendicitis.  OPERATIVE DETAILS:  Trevor Graves was seen and evaluated at the emergency room and risks and benefits of procedure were discussed in lay terms. Informed consent was obtained.  He was given therapeutic antibiotics and taken to the operating room, placed on the table in supine position. General endotracheal anesthesia was obtained and Foley catheter was placed.  The left arm was tucked and taped to the table.  His abdomen was prepped and draped in a standard surgical fashion.  Procedure time- out was performed with all operative team members to confirm proper patient and procedure and a supraumbilical midline incision was made in the skin and dissection carried down through the  subcutaneous tissue using blunt dissection.  The abdominal wall fascia was elevated and sharply incised.  The peritoneum entered bluntly.  A 12-mm port was placed at the umbilicus and pneumoperitoneum was obtained.  Laparoscope was introduced and there was no evidence of bleeding or bowel injury upon entry.  A 5-mm left lower quadrant port was placed under direct visualization and a 5-mm suprapubic port was also placed.  He had inflammatory changes and adhesions in the right lower quadrant.  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  The cecum was visible.  The terminal ileum was adhered over the iliac vessels, and this was cleared at the site of inflammatory change.  I rolled the terminal ileum off the retroperitoneum and underneath this, I was able to see the appendix, which was purple and necrotic appearing, thickened with inflammatory changes.  I was able to __________ and elevate the appendix and trace this down to its base at the takeoff of the cecum, however, this was still kind of stuck down to the retroperitoneum and I felt that the way that it was laying and if I placed another right upper quadrant port, I have a good trajectory for creating a window through the mesoappendix.  Then, I placed another 5 mm port in the right upper quadrant, and indeed this gave me  a good angle in order to create a window through the mesoappendix near the base of the appendix.  I was able to bluntly develop the space and place another 12-mm port and Endo- GIA purple Tri-Staple loads and transected the appendix at its base with the cecum.  The base appeared healthy and the staples were well formed. With the extra port, I was able to elevate both the tip of the appendix and the base of the appendix, and then used the Harmonic Scalpel to divide the mesoappendix up under the appendix taking care to avoid injury to the terminal ileum, which was nearby.  The appendix and the mesoappendix were  divided and the appendix was placed in an EndoCatch bag and removed from the umbilical trocar site.  It was sent to Pathology for permanent section.  I irrigated the right lower quadrant and pelvis with sterile saline solution and suctioned the fluid from the abdomen.  The staple line appeared well formed and the right lower quadrant was hemostatic.  There was no evidence of bleeding or bowel injury.  With the appendix removed, I closed the umbilical fascia using interrupted 0 Vicryl sutures.  The sutures were secured and the abdomen was re-insufflated with carbon dioxide gas and the abdominal wall closure was noted to be adequate without any evidence of bleeding or bowel injury.  The right lower quadrant still appeared to be hemostatic and I removed the trocars under direct visualization.  The abdominal wall was noted to be hemostatic.  The right upper quadrant trocar was removed and the wounds were injected with sterile saline solution, were injected with 1% lidocaine with epinephrine and 0.25% Marcaine in a 50:50 mixture.  The skin edges were approximated with 4-0 Monocryl subcuticular suture and the skin was washed and dried.  Dermabond was applied.  Sponge, needle, and instrument counts were correct in the case.  The patient tolerated the procedure well without apparent complications.          ______________________________ Lodema Pilot, MD     BL/MEDQ  D:  12/22/2012  T:  12/23/2012  Job:  [id number]

## 2012-12-23 NOTE — Progress Notes (Signed)
Patient seen and examined.  Agree with PA's note.  

## 2012-12-23 NOTE — Discharge Instructions (Signed)
[facility] ______[facility] °LAPAROSCOPIC SURGERY: POST OP INSTRUCTIONS °Always review your discharge instruction sheet given to you by the facility where your surgery was performed. °IF YOU HAVE DISABILITY OR FAMILY LEAVE FORMS, YOU MUST BRING THEM TO THE OFFICE FOR PROCESSING.   °DO NOT GIVE THEM TO YOUR DOCTOR. ° °1. A prescription for pain medication may be given to you upon discharge.  Take your pain medication as prescribed, if needed.  If narcotic pain medicine is not needed, then you may take acetaminophen (Tylenol) or ibuprofen (Advil) as needed. °2. Take your usually prescribed medications unless otherwise directed. °3. If you need a refill on your pain medication, please contact your pharmacy.  They will contact our office to request authorization. Prescriptions will not be filled after 5pm or on week-ends. °4. You should follow a light diet the first few days after arrival home, such as soup and crackers, etc.  Be sure to include lots of fluids daily. °5. Most patients will experience some swelling and bruising in the area of the incisions.  Ice packs will help.  Swelling and bruising can take several days to resolve.  °6. It is common to experience some constipation if taking pain medication after surgery.  Increasing fluid intake and taking a stool softener (such as Colace) will usually help or prevent this problem from occurring.  A mild laxative (Milk of Magnesia or Miralax) should be taken according to package instructions if there are no bowel movements after 48 hours. °7. Unless discharge instructions indicate otherwise, you may remove your bandages 24-48 hours after surgery, and you may shower at that time.  You may have steri-strips (small skin tapes) in place directly over the incision.  These strips should be left on the skin for 7-10 days.  If your surgeon used skin glue on the incision, you may shower in 24 hours.  The glue will flake off over the next 2-3 weeks.  Any sutures or  staples will be removed at the office during your follow-up visit. °8. ACTIVITIES:  You may resume regular (light) daily activities beginning the next day--such as daily self-care, walking, climbing stairs--gradually increasing activities as tolerated.  You may have sexual intercourse when it is comfortable.  Refrain from any heavy lifting or straining until approved by your doctor. °a. You may drive when you are no longer taking prescription pain medication, you can comfortably wear a seatbelt, and you can safely maneuver your car and apply brakes. °b. RETURN TO WORK:  __________________________________________________________ °9. You should see your doctor in the office for a follow-up appointment approximately 2-3 weeks after your surgery.  Make sure that you call for this appointment within a day or two after you arrive home to insure a convenient appointment time. °10. OTHER INSTRUCTIONS: __________________________________________________________________________________________________________________________ __________________________________________________________________________________________________________________________ °WHEN TO CALL YOUR DOCTOR: °1. Fever over 101.0 °2. Inability to urinate °3. Continued bleeding from incision. °4. Increased pain, redness, or drainage from the incision. °5. Increasing abdominal pain ° °The clinic staff is available to answer your questions during regular business hours.  Please don’t hesitate to call and ask to speak to one of the nurses for clinical concerns.  If you have a medical emergency, go to the nearest emergency room or call 911.  A surgeon from [facility] is always on call at the hospital. °[address], [address], [place], [place]  [address] ? [address] [address], , [place]   [address] °[phone] ? [phone] ? FAX [phone] °Web site:   [email or web address] °

## 2012-12-23 NOTE — Progress Notes (Signed)
Patient ID: Trevor Graves, male   DOB: March 11, 1976, 37 y.o.   MRN: [id number] 1 Day Post-Op  Subjective: Pt with a little more tenderness in RLQ, also had sweating last night, denies nv, pain at rest is only in RLQ  Objective: Vital signs in last 24 hours: Temp:  [98.6 F (37 C)-100.6 F (38.1 C)] 99.2 F (37.3 C) (04/09 0611) Pulse Rate:  [85-106] 85 (04/09 0611) Resp:  [18] 18 (04/09 0611) BP: (109-130)/(48-73) 109/73 mmHg (04/09 0611) SpO2:  [97 %-99 %] 97 % (04/09 0611) Last BM Date:  (pta)  Intake/Output from previous day: 04/08 0701 - 04/09 0700 In: 3697.1 [P.O.:720; I.V.:2977.1] Out: 3060 [Urine:3060] Intake/Output this shift:    PE: Abd: mildly distended, much more tender today esp in RLQ, incisions c.d.i, +bs General: awake, NAD  Lab Results:   Recent Labs  12/21/12 2003 12/23/12 0828  WBC 12.6* 7.5  HGB 15.4 13.1  HCT 44.0 37.3*  PLT 196 186   BMET  Recent Labs  12/21/12 2003  NA 135  K 3.9  CL 97  CO2 28  GLUCOSE 108*  BUN 8  CREATININE 0.86  CALCIUM 9.5   PT/INR No results found for this basename: LABPROT, INR,  in the last 72 hours CMP     Component Value Date/Time   NA 135 12/21/2012 2003   K 3.9 12/21/2012 2003   CL 97 12/21/2012 2003   CO2 28 12/21/2012 2003   GLUCOSE 108* 12/21/2012 2003   BUN 8 12/21/2012 2003   CREATININE 0.86 12/21/2012 2003   CREATININE 0.93 06/22/2012 1641   CALCIUM 9.5 12/21/2012 2003   PROT 7.1 06/22/2012 1641   ALBUMIN 4.9 06/22/2012 1641   AST 15 06/22/2012 1641   ALT 25 06/22/2012 1641   ALKPHOS 57 06/22/2012 1641   BILITOT 0.6 06/22/2012 1641   GFRNONAA >90 12/21/2012 2003   GFRAA >90 12/21/2012 2003   Lipase  No results found for this basename: lipase       Studies/Results: Ct Abdomen Pelvis W Contrast  12/21/2012  *RADIOLOGY REPORT*  Clinical Data: Worsening right lower quadrant abdominal pain.  CT ABDOMEN AND PELVIS WITH CONTRAST  Technique:  Multidetector CT imaging of the abdomen and pelvis was performed  following the standard protocol during bolus administration of intravenous contrast.  Contrast: OMNIPAQUE IOHEXOL 300 MG/ML  SOLN, 50mL OMNIPAQUE IOHEXOL 300 MG/ML  SOLN  Comparison: None.  Findings: There is evidence of ruptured acute appendicitis with significant inflammatory changes present inferior to the cecum. Visualized proximal to mid appendix is thickened, measuring 12 mm in greatest diameter.  The distal appendix is lost in an inflammatory mass with stranding and small areas of fluid present. No dominant drainable abscess is identified.  There is no evidence of visible appendicolith.  No associated bowel obstruction.  The liver, gallbladder, pancreas, adrenal glands, spleen and kidneys are within normal limits.  No hernias are seen.  Bladder is unremarkable.  No bony abnormalities.  IMPRESSION: Ruptured appendicitis with significant inflammatory changes and irregular fluid surrounding a distended appendix.  No focal discrete abscess is identified.   Original Report Authenticated By: Irish Lack, M.D.     Anti-infectives: Anti-infectives   Start     Dose/Rate Route Frequency Ordered Stop   12/23/12 0000  amoxicillin-clavulanate (AUGMENTIN) 875-125 MG per tablet     1 tablet Oral 2 times daily 12/23/12 0900     12/22/12 2000  ertapenem (INVANZ) 1 g in sodium chloride 0.9 % 50 mL IVPB  1 g 100 mL/hr over 30 Minutes Intravenous Every 24 hours 12/22/12 0440 12/22/12 2020   12/21/12 2000  ertapenem (INVANZ) 1 g in sodium chloride 0.9 % 50 mL IVPB     1 g 100 mL/hr over 30 Minutes Intravenous  Once 12/21/12 1959 12/21/12 2121       Assessment/Plan 1. POD#2-lap appy: isn't doing as well today, having fevers and more pain, needs to stay in hospital on IV antibiotics, diet as tolerated, recheck CBC in AM.    LOS: 2 days    Samil Mecham 12/23/2012

## 2012-12-24 LAB — CBC
HCT: 38.3 % — ABNORMAL LOW (ref 39.0–52.0)
Hemoglobin: 13.3 g/dL (ref 13.0–17.0)
MCH: 30.3 pg (ref 26.0–34.0)
MCHC: 34.7 g/dL (ref 30.0–36.0)
MCV: 87.2 fL (ref 78.0–100.0)
Platelets: 224 10*3/uL (ref 150–400)
RBC: 4.39 MIL/uL (ref 4.22–5.81)
RDW: 12.3 % (ref 11.5–15.5)
WBC: 6.1 10*3/uL (ref 4.0–10.5)

## 2012-12-24 MED ORDER — AMOXICILLIN-POT CLAVULANATE 875-125 MG PO TABS
1.0000 | ORAL_TABLET | Freq: Two times a day (BID) | ORAL | Status: DC
  Filled 2012-12-24: qty 1

## 2012-12-24 NOTE — Discharge Summary (Signed)
  Physician Discharge Summary  Patient ID: Trevor Graves MRN: [id number] DOB/AGE: December 11, 1975 37 y.o.  Admit date: 12/21/2012 Discharge date: 12/24/2012  Admitting Diagnosis: Acute Appendicitis  Discharge Diagnosis Acute Appendicitis  Consultants None  Procedures Laparoscopic Appendectomy 12/23/12 by Dr. Wynelle Beckmann Course 36 yr old male who [facility] to [facility] with abdominal pain.  Workup showed appendicitis.  Patient was admitted and underwent procedure listed above.  Tolerated procedure well and was transferred to the floor.  Diet was advanced as tolerated.  He was kept on IV antibiotics due to low grade fevers.  On POD#2, the patient was voiding well, tolerating diet, ambulating well, pain well controlled, vital signs stable, incisions c/d/i and felt stable for discharge home on antibiotics by mouth.  Patient will follow up in our office in 2 weeks and knows to call with questions or concerns.  PE at Discharge General: awake, alert, NAD Abd: soft, mildly tender over incisions, incisions c/d/i, +BS Ext: warm, no edema VSS afebrile    Medication List    TAKE these medications       amoxicillin-clavulanate 875-125 MG per tablet  Commonly known as:  AUGMENTIN  Take 1 tablet by mouth 2 (two) times daily.     HYDROcodone-acetaminophen 5-325 MG per tablet  Commonly known as:  NORCO/VICODIN  Take 1-2 tablets by mouth every 4 (four) hours as needed.             Follow-up Information   Follow up with LAYTON, BRIAN [name], DO In 2 weeks. (Our office will contact you with your appt day and time)    Contact information:   [address] [place] [address] [phone]       Signed: Denny Levy [facility] [phone]  12/24/2012, 11:15 AM

## 2012-12-24 NOTE — Discharge Summary (Signed)
Doing better today.  Will discharge.  Instructions given.

## 2013-01-14 ENCOUNTER — Ambulatory Visit (INDEPENDENT_AMBULATORY_CARE_PROVIDER_SITE_OTHER): Payer: Self-pay | Admitting: General Surgery

## 2013-01-14 ENCOUNTER — Encounter (INDEPENDENT_AMBULATORY_CARE_PROVIDER_SITE_OTHER): Payer: Self-pay | Admitting: General Surgery

## 2013-01-14 VITALS — BP 110/72 | HR 73 | Temp 97.4°F | Resp 18 | Ht 68.0 in | Wt 184.6 lb

## 2013-01-14 DIAGNOSIS — Z4889 Encounter for other specified surgical aftercare: Secondary | ICD-10-CM

## 2013-01-14 DIAGNOSIS — Z5189 Encounter for other specified aftercare: Secondary | ICD-10-CM

## 2013-01-14 NOTE — Progress Notes (Signed)
Subjective:     Patient ID: Trevor Graves, male   DOB: 10-08-1975, 37 y.o.   MRN: [id number]  HPI This patient follows up 3 weeks status post arthroscopic appendectomy for suspected perforated appendicitis. His pathology was consistent with transmural gangrenous appendicitis but otherwise benign. He says that he is recovered fine and is off pain medication. He is not return to work because he is afraid that he is going to her something to denies any significant discomfort, fevers, or chills. He is tolerating regular diet and his bowels are functioning normally.  Review of Systems     Objective:   Physical Exam No acute distress and nontoxic-appearing His abdomen is soft and nontender on exam his incisions are healing nicely without sign of infection    Assessment:     Status post laparoscopic appendectomy-doing well He seems to have recovered very nicely from his procedure and there is no evidence of any postoperative complication or abscess. His pathology benign. I recommended that he gradually increase his activity as tolerated and he can return to work as tolerated as well. I suspect that he will continue to improve and he can followup with me when necessary     Plan:     He can gradually increase activity as tolerated and followup when necessary

## 2014-09-29 ENCOUNTER — Encounter (HOSPITAL_COMMUNITY): Payer: Self-pay | Admitting: General Surgery

## 2015-08-13 ENCOUNTER — Emergency Department (HOSPITAL_COMMUNITY): Payer: Medicaid Other

## 2015-08-13 ENCOUNTER — Encounter (HOSPITAL_COMMUNITY): Payer: Self-pay | Admitting: Emergency Medicine

## 2015-08-13 DIAGNOSIS — M549 Dorsalgia, unspecified: Secondary | ICD-10-CM | POA: Insufficient documentation

## 2015-08-13 DIAGNOSIS — K2970 Gastritis, unspecified, without bleeding: Secondary | ICD-10-CM | POA: Insufficient documentation

## 2015-08-13 LAB — CBC
HEMATOCRIT: 44.1 % (ref 39.0–52.0)
HEMOGLOBIN: 15.9 g/dL (ref 13.0–17.0)
MCH: 31.5 pg (ref 26.0–34.0)
MCHC: 36.1 g/dL — AB (ref 30.0–36.0)
MCV: 87.5 fL (ref 78.0–100.0)
Platelets: 206 10*3/uL (ref 150–400)
RBC: 5.04 MIL/uL (ref 4.22–5.81)
RDW: 12.5 % (ref 11.5–15.5)
WBC: 7.1 10*3/uL (ref 4.0–10.5)

## 2015-08-13 LAB — BASIC METABOLIC PANEL
ANION GAP: 6 (ref 5–15)
BUN: 15 mg/dL (ref 6–20)
CALCIUM: 9.4 mg/dL (ref 8.9–10.3)
CO2: 27 mmol/L (ref 22–32)
Chloride: 104 mmol/L (ref 101–111)
Creatinine, Ser: 1.02 mg/dL (ref 0.61–1.24)
GFR calc Af Amer: >60 mL/min (ref >60)
GFR calc non Af Amer: >60 mL/min (ref >60)
GLUCOSE: 120 mg/dL — AB (ref 65–99)
Potassium: 3.5 mmol/L (ref 3.5–5.1)
Sodium: 137 mmol/L (ref 135–145)

## 2015-08-13 NOTE — ED Notes (Signed)
Patient with chest pain that started 30 minutes after eating.  He states that he is having shortness of breath with it.  He states the pain is in the center of his chest and radiates to his right flank.  Nausea and vomiting x2.

## 2015-08-14 ENCOUNTER — Emergency Department (HOSPITAL_COMMUNITY)
Admission: EM | Admit: 2015-08-14 | Discharge: 2015-08-14 | Disposition: A | Discharge status: Home / Self Care | Payer: Medicaid Other | Attending: Emergency Medicine | Admitting: Emergency Medicine

## 2015-08-14 ENCOUNTER — Emergency Department (HOSPITAL_COMMUNITY): Payer: Medicaid Other

## 2015-08-14 DIAGNOSIS — K2970 Gastritis, unspecified, without bleeding: Secondary | ICD-10-CM

## 2015-08-14 DIAGNOSIS — R1011 Right upper quadrant pain: Secondary | ICD-10-CM

## 2015-08-14 LAB — I-STAT TROPONIN, ED
TROPONIN I, POC: 0 ng/mL (ref 0.00–0.08)
Troponin i, poc: 0 ng/mL (ref 0.00–0.08)

## 2015-08-14 LAB — HEPATIC FUNCTION PANEL
ALK PHOS: 58 U/L (ref 38–126)
ALT: 38 U/L (ref 17–63)
AST: 46 U/L — ABNORMAL HIGH (ref 15–41)
Albumin: 4.3 g/dL (ref 3.5–5.0)
BILIRUBIN TOTAL: 0.6 mg/dL (ref 0.3–1.2)
TOTAL PROTEIN: 7 g/dL (ref 6.5–8.1)

## 2015-08-14 LAB — LIPASE, BLOOD: Lipase: 30 U/L (ref 11–51)

## 2015-08-14 MED ORDER — MORPHINE SULFATE (PF) 4 MG/ML IV SOLN
4.0000 mg | Freq: Once | INTRAVENOUS | Status: AC
  Administered 2015-08-14: 4 mg via INTRAVENOUS
  Filled 2015-08-14: qty 1

## 2015-08-14 MED ORDER — ONDANSETRON 4 MG PO TBDP: ORAL_TABLET | ORAL | Status: DC

## 2015-08-14 MED ORDER — ONDANSETRON HCL 4 MG/2ML IJ SOLN
4.0000 mg | Freq: Once | INTRAMUSCULAR | Status: AC
  Administered 2015-08-14: 4 mg via INTRAVENOUS
  Filled 2015-08-14: qty 2

## 2015-08-14 MED ORDER — GI COCKTAIL ~~LOC~~
30.0000 mL | Freq: Once | ORAL | Status: AC
  Administered 2015-08-14: 30 mL via ORAL
  Filled 2015-08-14: qty 30

## 2015-08-14 MED ORDER — PANTOPRAZOLE SODIUM 20 MG PO TBEC: 20.0000 mg | DELAYED_RELEASE_TABLET | Freq: Every day | ORAL | Status: DC

## 2015-08-14 MED ORDER — SODIUM CHLORIDE 0.9 % IV BOLUS (SEPSIS)
1000.0000 mL | Freq: Once | INTRAVENOUS | Status: AC
  Administered 2015-08-14: 1000 mL via INTRAVENOUS

## 2015-08-14 NOTE — Discharge Instructions (Signed)

## 2015-08-14 NOTE — ED Provider Notes (Signed)
CSN: [id number]     Arrival date & time 08/13/15  2259 History   By signing my name below, I, Arlan Organ, attest that this documentation has been prepared under the direction and in the presence of Loren Racer, MD.  Electronically Signed: Arlan Organ, ED Scribe. 08/14/2015. 1:08 AM.   Chief Complaint  Patient presents with  . Chest Pain   The history is provided by the patient. No language interpreter was used.    HPI Comments: Seddrick Flax is a 39 y.o. male without any pertinent past medical history who presents to the Emergency Department complaining of constant, ongoing R sided upper abdominal pain x 1 day; worsened this evening approximately 30 minutes after eating meat tacos. Pt states pain wraps around to his back. Currently pain is rated 8/10 which is mildly improved from earlier. Discomfort is made worse when standing and mildly improved when sitting. Ongoing nausea and 2 episodes of vomiting also reported. No OTC medications or home remedies attempted prior to arrival. No recent fever or chills. Mr. Schlarb reports similar pain prior to having his appendix removed.  PCP: No primary care provider on file.    Past Medical History  Diagnosis Date  . Medical history non-contributory    Past Surgical History  Procedure Laterality Date  . Eye surgery    . Laparoscopic appendectomy N/A 12/22/2012    Procedure: APPENDECTOMY LAPAROSCOPIC;  Surgeon: Lodema Pilot, DO;  Location: [facility];  Service: General;  Laterality: N/A;  . Appendectomy     No family history on file. Social History  Substance Use Topics  . Smoking status: Never Smoker   . Smokeless tobacco: Never Used  . Alcohol Use: No    Review of Systems  Constitutional: Negative for fever and chills.  Respiratory: Negative for cough and shortness of breath.   Cardiovascular: Negative for chest pain.  Gastrointestinal: Positive for nausea, vomiting and abdominal pain. Negative for diarrhea and constipation.   Genitourinary: Negative for dysuria, frequency, hematuria and flank pain.  Musculoskeletal: Positive for back pain. Negative for myalgias, neck pain and neck stiffness.  Skin: Negative for rash.  Neurological: Negative for dizziness, weakness, numbness and headaches.  Psychiatric/Behavioral: Negative for confusion.  All other systems reviewed and are negative.     Allergies  Review of patient's allergies indicates no known allergies.  Home Medications   Prior to Admission medications   Medication Sig Start Date End Date Taking? Authorizing Provider  ondansetron (ZOFRAN ODT) 4 MG disintegrating tablet  ODT q4 hours prn nausea/vomit 08/14/15   Loren Racer, MD  pantoprazole (PROTONIX) 20 MG tablet Take 1 tablet (20 mg total) by mouth daily. 08/14/15   Loren Racer, MD   Triage Vitals: BP 103/70 mmHg  Pulse 63  Temp(Src) 98.4 F (36.9 C) (Oral)  Resp 15  SpO2 96%   Physical Exam  Constitutional: He is oriented to person, place, and time. He appears well-developed and well-nourished. No distress.  HENT:  Head: Normocephalic and atraumatic.  Mouth/Throat: Oropharynx is clear and moist.  Eyes: EOM are normal. Pupils are equal, round, and reactive to light.  Neck: Normal range of motion. Neck supple.  Cardiovascular: Normal rate and regular rhythm.   Pulmonary/Chest: Effort normal and breath sounds normal. No respiratory distress. He has no wheezes. He has no rales. He exhibits no tenderness.  Abdominal: Soft. Bowel sounds are normal. He exhibits no distension and no mass. There is tenderness (mild right upper quadrant tenderness with palpation.). There is no rebound and no  guarding.  Musculoskeletal: Normal range of motion. He exhibits no edema or tenderness.  No CVA tenderness. No midline thoracic or lumbar tenderness.  Neurological: He is alert and oriented to person, place, and time.  Skin: Skin is warm and dry. No rash noted. No erythema.  Psychiatric: He has a  normal mood and affect. His behavior is normal.  Nursing note and vitals reviewed.   ED Course  Procedures (including critical care time)  DIAGNOSTIC STUDIES: Oxygen Saturation is 100% on RA, Normal by my interpretation.    COORDINATION OF CARE: 12:46 AM- Will give Morphine, Zofran, and fluids. Will order US abdomen limited RUQ, i-stat troponin, Lipase, hepatic function panel, BMP, EKG, and CBC. Discussed treatment plan with pt at bedside and pt agreed to plan.     Labs Review Labs Reviewed  BASIC METABOLIC PANEL - Abnormal; Notable for the following:    Glucose, Bld 120 (*)    All other components within normal limits  CBC - Abnormal; Notable for the following:    MCHC 36.1 (*)    All other components within normal limits  HEPATIC FUNCTION PANEL - Abnormal; Notable for the following:    AST 46 (*)    Bilirubin, Direct <0.1 (*)    All other components within normal limits  LIPASE, BLOOD  I-STAT TROPOININ, ED  I-STAT TROPOININ, ED    Imaging Review Dg Chest 2 View  08/14/2015  CLINICAL DATA:  Right lower chest pain, extending to the back, acute onset. Initial encounter. EXAM: CHEST  2 VIEW COMPARISON:  None. FINDINGS: The lungs are well-aerated and clear. There is no evidence of focal opacification, pleural effusion or pneumothorax. The heart is normal in size; the mediastinal contour is within normal limits. No acute osseous abnormalities are seen. IMPRESSION: No acute cardiopulmonary process seen. Electronically Signed   By: Roanna RaiderJeffery  Chang M.D.   On: 08/14/2015 00:10   I have personally reviewed and evaluated these images and lab results as part of my medical decision-making.   EKG Interpretation   Date/Time:  Sunday August 13 2015 23:10:29 EST Ventricular Rate:  72 PR Interval:  152 QRS Duration: 84 QT Interval:  362 QTC Calculation: 396 R Axis:   61 Text Interpretation:  Normal sinus rhythm Normal ECG Confirmed by  Ranae PalmsYELVERTON  MD, Kammi Hechler ([id number]) on 08/14/2015  12:46:42 AM      MDM   Final diagnoses:  Gastritis    I personally performed the services described in this documentation, which was scribed in my presence. The recorded information has been reviewed and is accurate.   Patient's symptoms are completely resolved after GI cocktail. Ultrasound shows polyps without gallstones or evidence of cholecystitis. The patient's symptoms seem to be more related to gastrointestinal abnormality. He has an EKG and troponin 2 which are normal. I have a low suspicion for coronary artery disease. Treat for gastritis and have follow-up with gastroenterology should his symptoms persist. Should his pain worsen he's been informed to return immediately to the emergency department.   Loren Raceravid Siddh Vandeventer, MD 08/15/15 1311

## 2017-01-29 ENCOUNTER — Emergency Department (HOSPITAL_COMMUNITY): Payer: Self-pay

## 2017-01-29 ENCOUNTER — Emergency Department (HOSPITAL_COMMUNITY)
Admission: EM | Admit: 2017-01-29 | Discharge: 2017-01-29 | Disposition: A | Discharge status: Home / Self Care | Payer: Self-pay | Attending: Emergency Medicine | Admitting: Emergency Medicine

## 2017-01-29 ENCOUNTER — Encounter (HOSPITAL_COMMUNITY): Payer: Self-pay | Admitting: Emergency Medicine

## 2017-01-29 DIAGNOSIS — R1013 Epigastric pain: Secondary | ICD-10-CM | POA: Insufficient documentation

## 2017-01-29 DIAGNOSIS — Z79899 Other long term (current) drug therapy: Secondary | ICD-10-CM | POA: Insufficient documentation

## 2017-01-29 DIAGNOSIS — R1011 Right upper quadrant pain: Secondary | ICD-10-CM | POA: Insufficient documentation

## 2017-01-29 LAB — CBC
HEMATOCRIT: 43 % (ref 39.0–52.0)
Hemoglobin: 15.6 g/dL (ref 13.0–17.0)
MCH: 31.2 pg (ref 26.0–34.0)
MCHC: 36.3 g/dL — AB (ref 30.0–36.0)
MCV: 86 fL (ref 78.0–100.0)
PLATELETS: 214 10*3/uL (ref 150–400)
RBC: 5 MIL/uL (ref 4.22–5.81)
RDW: 12.5 % (ref 11.5–15.5)
WBC: 7.1 10*3/uL (ref 4.0–10.5)

## 2017-01-29 LAB — BASIC METABOLIC PANEL
Anion gap: 9 (ref 5–15)
BUN: 10 mg/dL (ref 6–20)
CHLORIDE: 101 mmol/L (ref 101–111)
CO2: 24 mmol/L (ref 22–32)
CREATININE: 0.84 mg/dL (ref 0.61–1.24)
Calcium: 9.4 mg/dL (ref 8.9–10.3)
GFR calc Af Amer: >60 mL/min (ref >60)
GFR calc non Af Amer: >60 mL/min (ref >60)
GLUCOSE: 117 mg/dL — AB (ref 65–99)
POTASSIUM: 3.4 mmol/L — AB (ref 3.5–5.1)
SODIUM: 134 mmol/L — AB (ref 135–145)

## 2017-01-29 LAB — LIPASE, BLOOD: LIPASE: 28 U/L (ref 11–51)

## 2017-01-29 LAB — I-STAT TROPONIN, ED: Troponin i, poc: 0 ng/mL (ref 0.00–0.08)

## 2017-01-29 MED ORDER — GI COCKTAIL ~~LOC~~
30.0000 mL | Freq: Once | ORAL | Status: AC
  Administered 2017-01-29: 30 mL via ORAL
  Filled 2017-01-29: qty 30

## 2017-01-29 MED ORDER — DICYCLOMINE HCL 10 MG PO CAPS
10.0000 mg | ORAL_CAPSULE | Freq: Once | ORAL | Status: AC
  Administered 2017-01-29: 10 mg via ORAL
  Filled 2017-01-29: qty 1

## 2017-01-29 MED ORDER — ONDANSETRON 4 MG PO TBDP: 4.0000 mg | ORAL_TABLET | Freq: Three times a day (TID) | ORAL | 0 refills | Status: DC | PRN

## 2017-01-29 MED ORDER — DICYCLOMINE HCL 20 MG PO TABS: 20.0000 mg | ORAL_TABLET | Freq: Two times a day (BID) | ORAL | 0 refills | Status: DC

## 2017-01-29 MED ORDER — OXYCODONE-ACETAMINOPHEN 5-325 MG PO TABS
1.0000 | ORAL_TABLET | Freq: Once | ORAL | Status: AC
  Administered 2017-01-29: 1 via ORAL
  Filled 2017-01-29: qty 1

## 2017-01-29 MED ORDER — MORPHINE SULFATE (PF) 4 MG/ML IV SOLN
4.0000 mg | Freq: Once | INTRAVENOUS | Status: AC
  Administered 2017-01-29: 4 mg via INTRAVENOUS
  Filled 2017-01-29: qty 1

## 2017-01-29 MED ORDER — ONDANSETRON HCL 4 MG/2ML IJ SOLN
4.0000 mg | Freq: Once | INTRAMUSCULAR | Status: AC
  Administered 2017-01-29: 4 mg via INTRAVENOUS
  Filled 2017-01-29: qty 2

## 2017-01-29 MED ORDER — PANTOPRAZOLE SODIUM 40 MG IV SOLR
40.0000 mg | Freq: Once | INTRAVENOUS | Status: AC
  Administered 2017-01-29: 40 mg via INTRAVENOUS
  Filled 2017-01-29: qty 40

## 2017-01-29 MED ORDER — OMEPRAZOLE 20 MG PO CPDR: 20.0000 mg | DELAYED_RELEASE_CAPSULE | Freq: Every day | ORAL | 0 refills | Status: DC

## 2017-01-29 MED ORDER — SODIUM CHLORIDE 0.9 % IV BOLUS (SEPSIS)
1000.0000 mL | Freq: Once | INTRAVENOUS | Status: AC
  Administered 2017-01-29: 1000 mL via INTRAVENOUS

## 2017-01-29 NOTE — ED Notes (Signed)
Pt back from xray, EDP aware pt very uncomfortable and in pain. Pt aware EDP will be in shortly.

## 2017-01-29 NOTE — ED Provider Notes (Signed)
[facility]-EMERGENCY DEPT Provider Note   CSN: [id number] Arrival date & time: 01/29/17  0131  By signing my name below, I, Phillips ClimesFabiola de Louis, attest that this documentation has been prepared under the direction and in the presence of Horton, Mayer Maskerourtney F, MD . Electronically Signed: Phillips ClimesFabiola de Louis, Scribe. 01/29/2017. 3:59 AM.   History   Chief Complaint Chief Complaint  Patient presents with  . Abdominal Pain  . Chest Pain  . Back Pain   Trevor Graves is a 41 y.o. male with a PMHx of appendicitis s/p appendectomy and gastritis, who presents to the Emergency Department with complaints of abdominal pain x5 hours ago, after eating. He describes this as pressure, with radiation to his back and dyspnea 2/2 to discomfort. He states that he has experienced these sx before x1 year ago with less severity, however is unsure whether he was diagnosed with acid reflux or gastritis at that time. Denies any shortness of breath or fevers. Current pain is 8 out of 10. He has not taken anything for the pain.  Here, he denies experiencing any other acute sx, including vomiting, diarrhea, hematuria or dysuria.  The history is provided by the patient and medical records. No language interpreter was used.    Past Medical History:  Diagnosis Date  . Medical history non-contributory     There are no active problems to display for this patient.   Past Surgical History:  Procedure Laterality Date  . APPENDECTOMY    . EYE SURGERY    . LAPAROSCOPIC APPENDECTOMY N/A 12/22/2012   Procedure: APPENDECTOMY LAPAROSCOPIC;  Surgeon: Lodema PilotBrian Layton, DO;  Location: [facility];  Service: General;  Laterality: N/A;       Home Medications    Prior to Admission medications   Medication Sig Start Date End Date Taking? Authorizing Provider  ondansetron (ZOFRAN ODT) 4 MG disintegrating tablet 4mg  ODT q4 hours prn nausea/vomit 08/14/15   Loren RacerYelverton, [name], MD  pantoprazole (PROTONIX) 20 MG tablet Take 1 tablet (20 mg total)  by mouth daily. 08/14/15   Loren RacerYelverton, [name], MD    Family History History reviewed. No pertinent family history.  Social History Social History  Substance Use Topics  . Smoking status: Never Smoker  . Smokeless tobacco: Never Used  . Alcohol use No     Allergies   Patient has no known allergies.   Review of Systems Review of Systems  Constitutional: Negative for fever.  Respiratory: Positive for shortness of breath.   Cardiovascular: Negative for chest pain.  Gastrointestinal: Positive for abdominal pain. Negative for diarrhea and vomiting.  Genitourinary: Negative for dysuria and hematuria.  All other systems reviewed and are negative.   Physical Exam Updated Vital Signs BP 134/90   Pulse 69   Temp 97.4 F (36.3 C) (Oral)   Resp (!) 26   Ht 5\' 8"  (1.727 m)   Wt 192 lb (87.1 kg)   SpO2 100%   BMI 29.19 kg/m   Physical Exam  Constitutional: He is oriented to person, place, and time. He appears well-developed and well-nourished. No distress.  Uncomfortable appearing  HENT:  Head: Normocephalic and atraumatic.  Mucous membranes moist  Cardiovascular: Normal rate, regular rhythm and normal heart sounds.   No murmur heard. Pulmonary/Chest: Effort normal and breath sounds normal. No respiratory distress. He has no wheezes.  Abdominal: Soft. Bowel sounds are normal. There is tenderness. There is no rebound.  Epigastric and right upper quadrant tenderness to palpation without rebound or guarding  Musculoskeletal: He exhibits no  edema.  Neurological: He is alert and oriented to person, place, and time.  Skin: Skin is warm and dry.  Psychiatric: He has a normal mood and affect.  Nursing note and vitals reviewed.    ED Treatments / Results  DIAGNOSTIC STUDIES: Oxygen Saturation is 100% on RA, nl by my interpretation.    COORDINATION OF CARE: 2:51 AM Discussed treatment plan with pt at bedside and pt agreed to plan.  Labs (all labs ordered are listed, but only  abnormal results are displayed) Labs Reviewed  BASIC METABOLIC PANEL - Abnormal; Notable for the following:       Result Value   Sodium 134 (*)    Potassium 3.4 (*)    Glucose, Bld 117 (*)    All other components within normal limits  CBC - Abnormal; Notable for the following:    MCHC 36.3 (*)    All other components within normal limits  LIPASE, BLOOD  I-STAT TROPOININ, ED    EKG  EKG Interpretation  Date/Time:  Wednesday Jan 29 2017 01:38:08 EDT Ventricular Rate:  65 PR Interval:  140 QRS Duration: 90 QT Interval:  388 QTC Calculation: 403 R Axis:   23 Text Interpretation:  Normal sinus rhythm Normal ECG Confirmed by Ross Marcus ([id number]) on 01/29/2017 2:45:17 AM       Radiology Dg Chest 2 View  Result Date: 01/29/2017 CLINICAL DATA:  Chest pain. Epigastric pain for 4 hours. Shortness of breath. EXAM: CHEST  2 VIEW COMPARISON:  08/13/2015 FINDINGS: The cardiomediastinal contours are normal. The lungs are clear. Pulmonary vasculature is normal. No consolidation, pleural effusion, or pneumothorax. No acute osseous abnormalities are seen. Tiny metallic foreign body in the subcutaneous tissues of the anterior left chest wall. IMPRESSION: No acute pulmonary process. Electronically Signed   By: Rubye Oaks M.D.   On: 01/29/2017 02:32    Procedures Procedures (including critical care time)  Medications Ordered in ED Medications  sodium chloride 0.9 % bolus 1,000 mL (1,000 mLs Intravenous New Bag/Given 01/29/17 0258)  gi cocktail (Maalox,Lidocaine,Donnatal) (30 mLs Oral Given 01/29/17 0258)  morphine 4 MG/ML injection 4 mg (4 mg Intravenous Given 01/29/17 0258)  ondansetron (ZOFRAN) injection 4 mg (4 mg Intravenous Given 01/29/17 0258)     Initial Impression / Assessment and Plan / ED Course  I have reviewed the triage vital signs and the nursing notes.  Pertinent labs & imaging results that were available during my care of the patient were reviewed by me and  considered in my medical decision making (see chart for details).     Patient presents with abdominal pain. Similar to one year ago when he was diagnosed with gastritis. He's uncomfortable appearing but nontoxic. Tenderness without signs of peritonitis. Lab work obtained and largely unremarkable including a normal lipase. Chest x-ray, troponin, EKG normal. Initially patient was given morphine, Zofran, normal saline, and a GI cocktail. He states that he did have some relief following the GI cocktail. However, he has continued pain. He was additionally given Protonix and Bentyl. He is able to tolerate fluids. Pain is improved but not completely resolved. Feel this is most likely related to upper GI etiology GERD versus gastritis versus peptic ulcer. Will initiate omeprazole daily. GI follow-up provided.  After history, exam, and medical workup I feel the patient has been appropriately medically screened and is safe for discharge home. Pertinent diagnoses were discussed with the patient. Patient was given return precautions.   Final Clinical Impressions(s) / ED Diagnoses   Final  diagnoses:  Epigastric pain    New Prescriptions New Prescriptions   No medications on file   I personally performed the services described in this documentation, which was scribed in my presence. The recorded information has been reviewed and is accurate.     Shon Baton, MD 01/29/17 0700

## 2017-01-29 NOTE — ED Notes (Signed)
EDP at bedside  

## 2017-01-29 NOTE — ED Triage Notes (Signed)
Patient arrives with complaint of abdominal pain. Observed patient at check exhibiting Levine's sign. When asked about radiation of pain in triage, patient reports pain in chest and back. States sudden onset about 2 hours ago. States pain as heaviness. Endorses previous history of similar, "but not nearly as intense", 1 year ago.

## 2017-01-29 NOTE — Discharge Instructions (Signed)
You were seen today for abdominal pain. Your symptoms are most consistent with a gastritis or peptic ulcer disease. Take omeprazole daily. Bland diet. If you have any new or worsening symptoms she needs to be reevaluated. Gastroenterology follow-up provided if symptoms persist or worsen.

## 2018-05-09 ENCOUNTER — Emergency Department (HOSPITAL_COMMUNITY)
Admission: EM | Admit: 2018-05-09 | Discharge: 2018-05-10 | Disposition: A | Discharge status: Home / Self Care | Payer: Self-pay | Attending: Emergency Medicine | Admitting: Emergency Medicine

## 2018-05-09 DIAGNOSIS — Z79899 Other long term (current) drug therapy: Secondary | ICD-10-CM | POA: Insufficient documentation

## 2018-05-09 DIAGNOSIS — N50812 Left testicular pain: Secondary | ICD-10-CM | POA: Insufficient documentation

## 2018-05-10 ENCOUNTER — Encounter (HOSPITAL_COMMUNITY): Payer: Self-pay

## 2018-05-10 ENCOUNTER — Emergency Department (HOSPITAL_COMMUNITY): Payer: Self-pay

## 2018-05-10 ENCOUNTER — Other Ambulatory Visit: Payer: Self-pay

## 2018-05-10 LAB — URINALYSIS, ROUTINE W REFLEX MICROSCOPIC
BACTERIA UA: NONE SEEN
BILIRUBIN URINE: NEGATIVE
Glucose, UA: NEGATIVE mg/dL
KETONES UR: NEGATIVE mg/dL
LEUKOCYTES UA: NEGATIVE
NITRITE: NEGATIVE
Protein, ur: NEGATIVE mg/dL
Specific Gravity, Urine: 1.027 (ref 1.005–1.030)
pH: 5 (ref 5.0–8.0)

## 2018-05-10 MED ORDER — OXYCODONE-ACETAMINOPHEN 5-325 MG PO TABS
1.0000 | ORAL_TABLET | Freq: Once | ORAL | Status: AC
  Administered 2018-05-10: 1 via ORAL
  Filled 2018-05-10: qty 1

## 2018-05-10 MED ORDER — HYDROCODONE-ACETAMINOPHEN 5-325 MG PO TABS: 1.0000 | ORAL_TABLET | ORAL | 0 refills | Status: DC | PRN

## 2018-05-10 MED ORDER — KETOROLAC TROMETHAMINE 30 MG/ML IJ SOLN
15.0000 mg | Freq: Once | INTRAMUSCULAR | Status: AC
  Administered 2018-05-10: 15 mg via INTRAMUSCULAR
  Filled 2018-05-10: qty 1

## 2018-05-10 NOTE — ED Triage Notes (Signed)
Per pt, was standing on tailgate, slipped, and hit left testicle on tow hitch. Pt took 324 aspirin with no pain relief. Testicle is swollen. No bleeding or discharge

## 2018-05-10 NOTE — Discharge Instructions (Addendum)
You do have a bruise to your left testicle.  This will be treated with pain medication.  I have given you narcotic pain medication that will make you drowsy so do not drive with this.  You also need to take NSAIDs such as Aleve, Motrin or ibuprofen around-the-clock.  Keep ice to the affected area to help with swelling and pain.  Use a scrotal support to help with pain.  Please make sure that you follow-up with the urologist next week and return the ED with any abdominal pain, worsening pain, vomiting or for any other reason.

## 2018-05-10 NOTE — ED Notes (Signed)
Unable to void at this time.

## 2018-05-10 NOTE — ED Provider Notes (Signed)
[facility] Provider [facility]   CSN: [id number] Arrival date & time: 05/09/18  2325     History   Chief Complaint Chief Complaint  Patient presents with  . Testicle Pain    HPI Trevor Graves is a 42 y.o. male.  HPI 42 year old male presents to the ED with no pertinent past medical history for evaluation of left testicular pain and swelling.  Patient states that approximately 12 PM this afternoon he hit his left testicle on the toe hitch after he slipped off the tailgate.  Patient denies any hematuria.  Denies any abdominal pain.  Denies any head injury or LOC.  He took 3 aspirin earlier today with only minimal relief of his pain.  Patient denies any penile discharge.  Palpation and walking makes the pain worse.  Nothing makes the pain better.  Has been able to urinate today.  Denies nausea or vomiting. Past Medical History:  Diagnosis Date  . Medical history non-contributory     There are no active problems to display for this patient.   Past Surgical History:  Procedure Laterality Date  . APPENDECTOMY    . EYE SURGERY    . LAPAROSCOPIC APPENDECTOMY N/A 12/22/2012   Procedure: APPENDECTOMY LAPAROSCOPIC;  Surgeon: Lodema PilotBrian Layton, DO;  Location: [facility];  Service: General;  Laterality: N/A;        Home Medications    Prior to Admission medications   Medication Sig Start Date End Date Taking? Authorizing Provider  dicyclomine (BENTYL) 20 MG tablet Take 1 tablet (20 mg total) by mouth 2 (two) times daily. 01/29/17   Horton, Mayer Maskerourtney F, MD  omega-3 acid ethyl esters (LOVAZA) 1 g capsule Take 1 g by mouth daily.    [provider]  omeprazole (PRILOSEC) 20 MG capsule Take 1 capsule (20 mg total) by mouth daily. 01/29/17   Horton, Mayer Maskerourtney F, MD  ondansetron (ZOFRAN ODT) 4 MG disintegrating tablet Take 1 tablet (4 mg total) by mouth every 8 (eight) hours as needed for nausea or vomiting. 01/29/17   Horton, Mayer Maskerourtney F, MD    Family  History History reviewed. No pertinent family history.  Social History Social History   Tobacco Use  . Smoking status: Never Smoker  . Smokeless tobacco: Never Used  Substance Use Topics  . Alcohol use: No  . Drug use: No     Allergies   Patient has no known allergies.   Review of Systems Review of Systems  All other systems reviewed and are negative.    Physical Exam Updated Vital Signs BP 127/84 (BP Location: Right Arm)   Pulse 79   Temp 98.3 F (36.8 C) (Oral)   Resp 16   Ht 5\' 8"  (1.727 m)   Wt 79.4 kg   SpO2 100%   BMI 26.61 kg/m   Physical Exam  Constitutional: He appears well-developed and well-nourished. No distress.  HENT:  Head: Normocephalic and atraumatic.  Eyes: Right eye exhibits no discharge. Left eye exhibits no discharge. No scleral icterus.  Neck: Normal range of motion.  Pulmonary/Chest: No respiratory distress.  Abdominal: Soft. Normal appearance and bowel sounds are normal. There is no rigidity, no rebound, no guarding, no CVA tenderness, no tenderness at McBurney's point and negative Murphy's sign.  No ecchymosis.  Genitourinary:  Genitourinary Comments: Chaperone present for exam. uncirumcised male. No penile discharge, erythema, tenderness, lesion, or rash. 2 descended testes.  Normal right testicle.  Left testicle is edematous and tender to palpation.  No obvious  ecchymosis.  Perineum is without any edema or ecchymosis.  No inguinal lymphadenopathy or hernia.    Musculoskeletal: Normal range of motion.  Neurological: He is alert.  Skin: No pallor.  Psychiatric: His behavior is normal. Judgment and thought content normal.  Nursing note and vitals reviewed.    ED Treatments / Results  Labs (all labs ordered are listed, but only abnormal results are displayed) Labs Reviewed  URINALYSIS, ROUTINE W REFLEX MICROSCOPIC - Abnormal; Notable for the following components:      Result Value   Hgb urine dipstick SMALL (*)    All other  components within normal limits    EKG None  Radiology US Scrotum W/doppler  Result Date: 05/10/2018 CLINICAL DATA:  Testicular pain.  Injury to left testicle today. EXAM: SCROTAL ULTRASOUND DOPPLER ULTRASOUND OF THE TESTICLES TECHNIQUE: Complete ultrasound examination of the testicles, epididymis, and other scrotal structures was performed. Color and spectral Doppler ultrasound were also utilized to evaluate blood flow to the testicles. COMPARISON:  None. FINDINGS: Right testicle Measurements: 3.5 x 2.0 x 3.0 cm. Homogeneous echotexture with normal blood flow. No mass or microlithiasis visualized. Left testicle Measurements: 5.3 x 2.5 x 3.0 cm. Within the testicle is a heterogeneous 2.8 x 1.5 x 2.0 cm lesion. No internal blood flow. Smaller adjacent heterogeneous regions. No definite visualized disruption of the tunic albuginea. Blood flow is noted to the testicular parenchyma. Right epididymis:  Normal in size and appearance. Left epididymis:  Normal in size and appearance. Hydrocele:  Small on the left without evidence of large hematocele. Varicocele:  None visualized. Pulsed Doppler interrogation of both testes demonstrates normal low resistance arterial and venous waveforms bilaterally. IMPRESSION: 1. Heterogeneous avascular intra testicular lesions, largest measuring 2.8 x 1.5 x 2.0 cm consistent with hematoma in the setting of scrotal trauma. No evidence of tunic albuginea disruption to suggest testicular rupture. Recommend close clinical follow-up, as testicular mass could have a similar appearance, felt less likely given history. 2. Normal blood flow to both testis without torsion. Electronically Signed   By: Rubye Oaks M.D.   On: 05/10/2018 01:35    Procedures Procedures (including critical care time)  Medications Ordered in ED Medications  ketorolac (TORADOL) 30 MG/ML injection 15 mg (15 mg Intramuscular Given 05/10/18 0215)  oxyCODONE-acetaminophen (PERCOCET/ROXICET) 5-325 MG per  tablet 1 tablet (1 tablet Oral Given 05/10/18 0229)     Initial Impression / Assessment and Plan / ED Course  I have reviewed the triage vital signs and the nursing notes.  Pertinent labs & imaging results that were available during my care of the patient were reviewed by me and considered in my medical decision making (see chart for details).     She presents to the ED after sustaining trauma to his left testicle approximately 12 hours ago.  Reports associated swelling to the left testicle and pain.  Denies hematuria, abdominal pain.  UA does not show any RBCs or signs of infection.  Ultrasound was obtained that shows concern for hematoma given the injury.  Do not see any disruption of the tunica albugenia.  No signs of testicular torsion.  Discussed patient with Dr. Mena Goes with urology concerning patient.  He states that patient needs symptomatic care pain control, anti-inflammatories and ice therapy.  Has no further recommendations at this time.  Pain controlled in the ED.  Will give urology follow-up.  No other signs of acute intracranial, intrathoracic trauma.  Discussed symptomatic care at home.  We will give short course of pain  medication.  Pt is hemodynamically stable, in NAD, & able to ambulate in the ED. Evaluation does not show pathology that would require ongoing emergent intervention or inpatient treatment. I explained the diagnosis to the patient. Pain has been managed & has no complaints prior to dc. Pt is comfortable with above plan and is stable for discharge at this time. All questions were answered prior to disposition. Strict return precautions for f/u to the ED were discussed. Encouraged follow up with PCP.   Final Clinical Impressions(s) / ED Diagnoses   Final diagnoses:  Left testicular pain    ED Discharge Orders         Ordered    HYDROcodone-acetaminophen (NORCO/VICODIN) 5-325 MG tablet  Every 4 hours PRN     05/10/18 0216           Rise Mu,  PA-C 05/10/18 0533    Glynn Octave, MD 05/10/18 [phone]

## 2019-01-29 IMAGING — US US SCROTUM W/ DOPPLER COMPLETE
1 series · 13 of 25 positions shown · non-contrast
Comparison: None.

CLINICAL DATA: Testicular pain.  Injury to left testicle today.

EXAM:
SCROTAL ULTRASOUND
DOPPLER ULTRASOUND OF THE TESTICLES
TECHNIQUE: Complete ultrasound examination of the testicles, epididymis, and
other scrotal structures was performed. Color and spectral Doppler
ultrasound were also utilized to evaluate blood flow to the
testicles.

[Series 1: us scrotum w/ doppler complete · 0.09mm/px · 61 acquisitions, 13 frames shown]
[im 1/61]
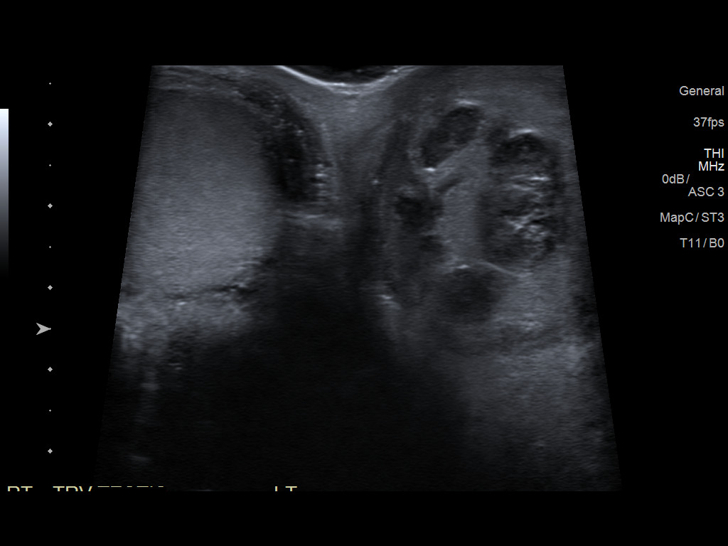
[im 6/61]
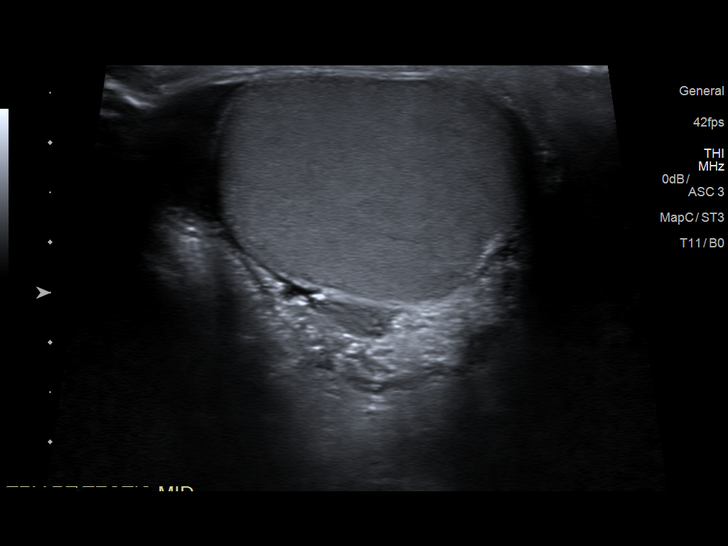
[im 11/61]
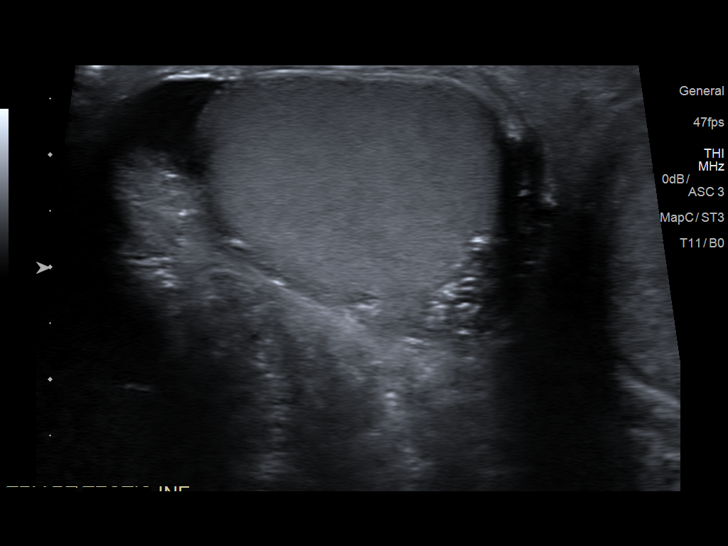
[im 16/61]
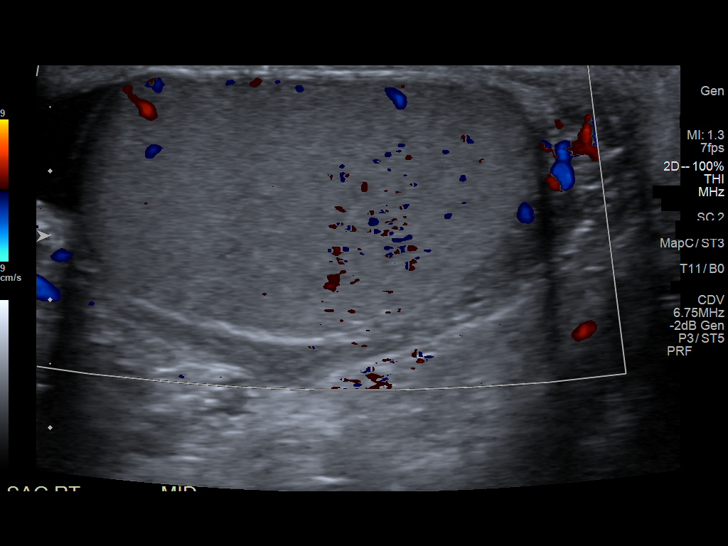
[im 21/61]
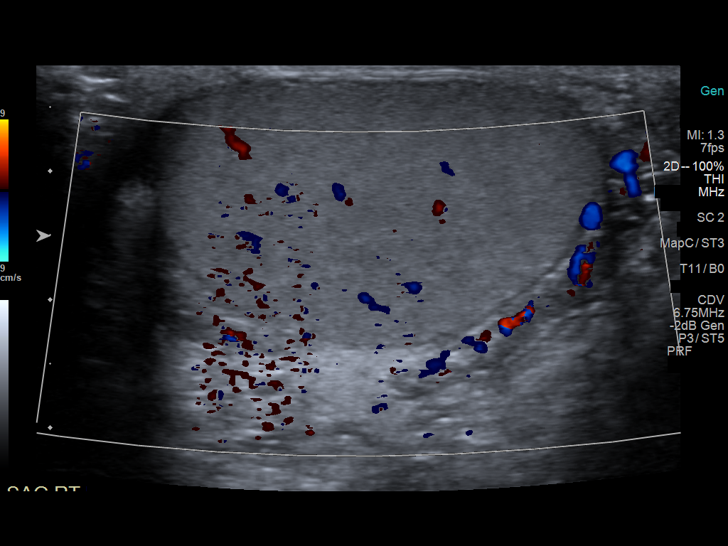
[im 26/61]
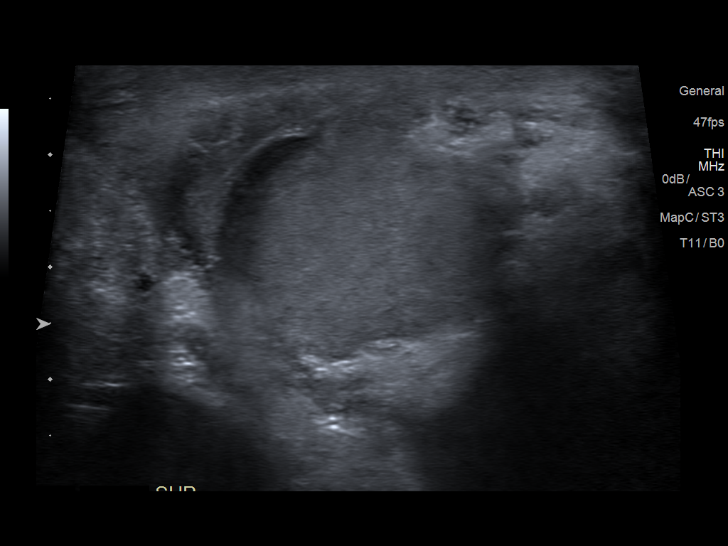
[im 31/61]
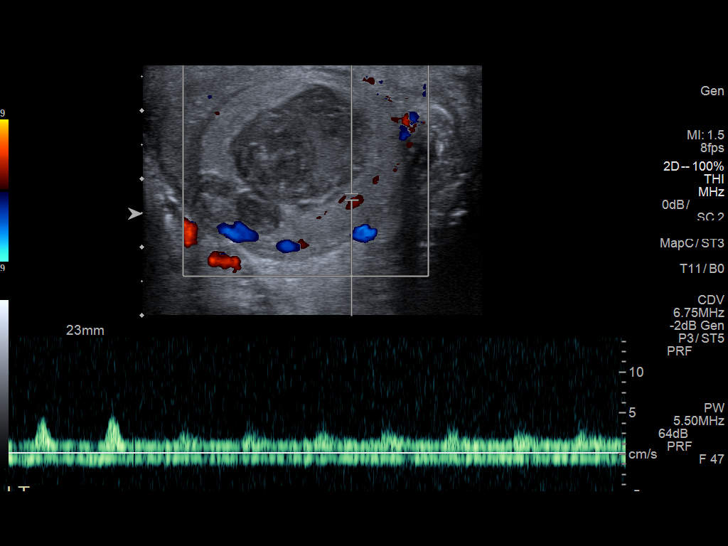
[im 36/61]
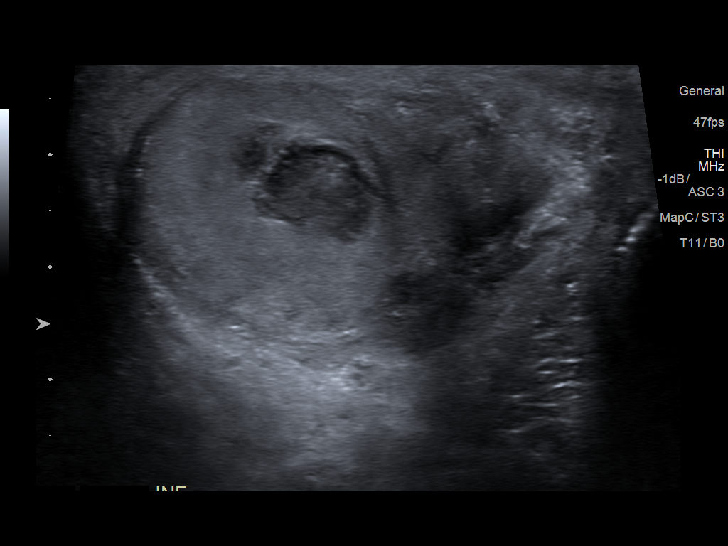
[im 41/61]
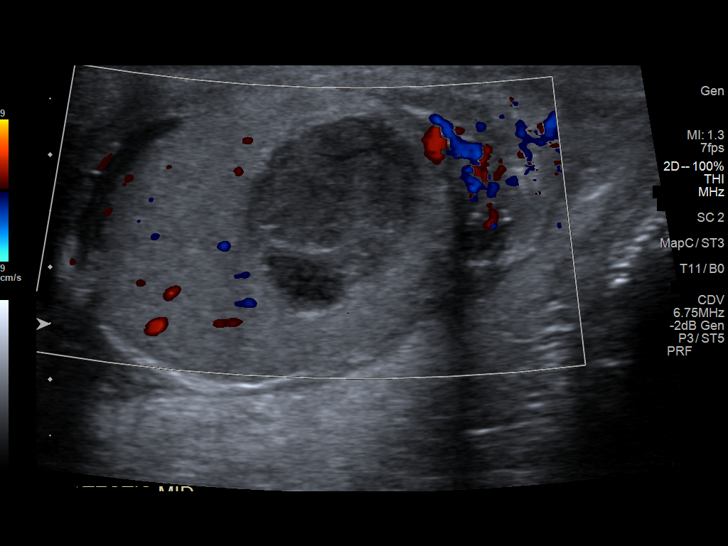
[im 46/61]
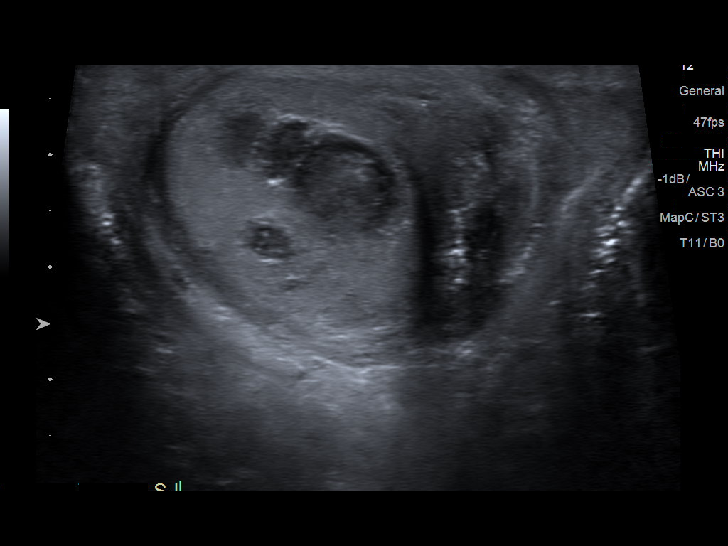
[im 51/61]
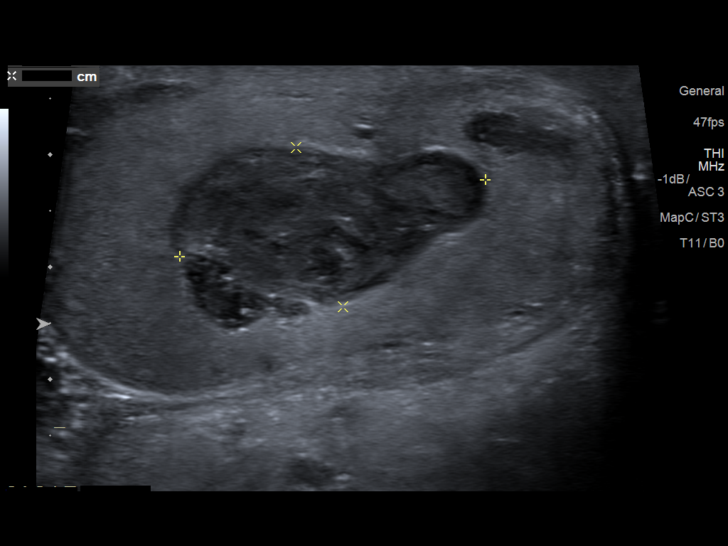
[im 56/61]
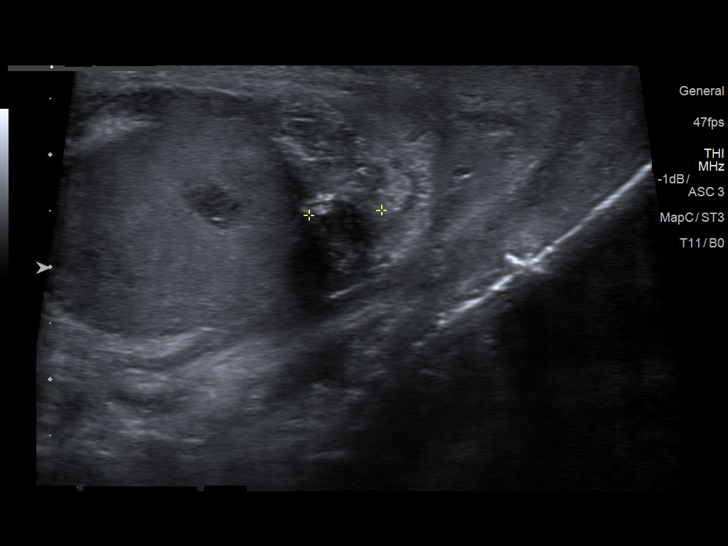
[im 61/61]
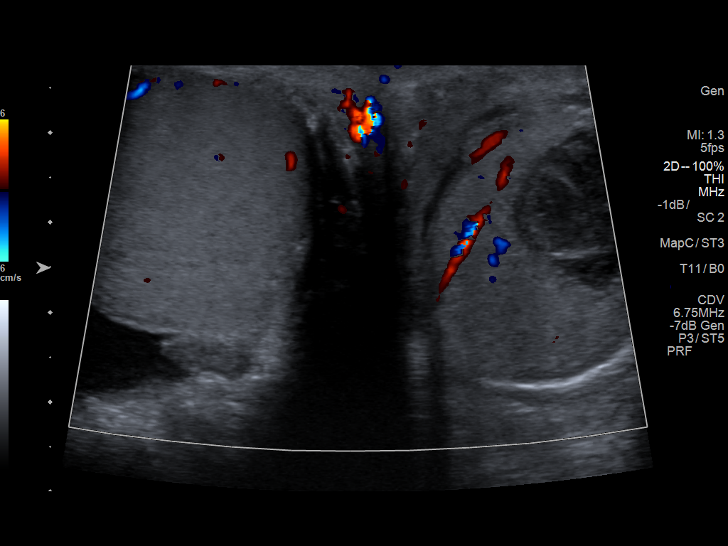

[13 of 25 positions shown; findings below may reference images not displayed]

FINDINGS: Right testicle

Measurements: 3.5 x 2.0 x 3.0 cm. Homogeneous echotexture with
normal blood flow. No mass or microlithiasis visualized.

Left testicle

Measurements: 5.3 x 2.5 x 3.0 cm. Within the testicle is a
heterogeneous 2.8 x 1.5 x 2.0 cm lesion. No internal blood flow.
Smaller adjacent heterogeneous regions. No definite visualized
disruption of the tunic albuginea. Blood flow is noted to the
testicular parenchyma.

Right epididymis:  Normal in size and appearance.

Left epididymis:  Normal in size and appearance.

Hydrocele:  Small on the left without evidence of large hematocele.

Varicocele:  None visualized.

Pulsed Doppler interrogation of both testes demonstrates normal low
resistance arterial and venous waveforms bilaterally.
IMPRESSION: 1. Heterogeneous avascular intra testicular lesions, largest
measuring 2.8 x 1.5 x 2.0 cm consistent with hematoma in the setting
of scrotal trauma. No evidence of tunic albuginea disruption to
suggest testicular rupture. Recommend close clinical follow-up, as
testicular mass could have a similar appearance, felt less likely
given history.
2. Normal blood flow to both testis without torsion.

## 2020-11-13 ENCOUNTER — Ambulatory Visit
Admission: RE | Admit: 2020-11-13 | Discharge: 2020-11-13 | Disposition: A | Discharge status: Home / Self Care | Payer: Self-pay | Source: Ambulatory Visit | Attending: Nurse Practitioner | Admitting: Nurse Practitioner

## 2020-11-13 ENCOUNTER — Other Ambulatory Visit: Payer: Self-pay | Admitting: Nurse Practitioner

## 2020-11-13 ENCOUNTER — Other Ambulatory Visit: Payer: Self-pay

## 2020-11-13 DIAGNOSIS — R079 Chest pain, unspecified: Secondary | ICD-10-CM

## 2022-07-29 IMAGING — CR DG CHEST 2V
2 series · 2 of 2 positions shown · non-contrast
Comparison: 01/29/2017

CLINICAL DATA: Fall 6 days ago, with blunt trauma to right chest.
Right lateral chest pain and cough. Initial encounter.

EXAM:
CHEST - 2 VIEW

[w chest pa]
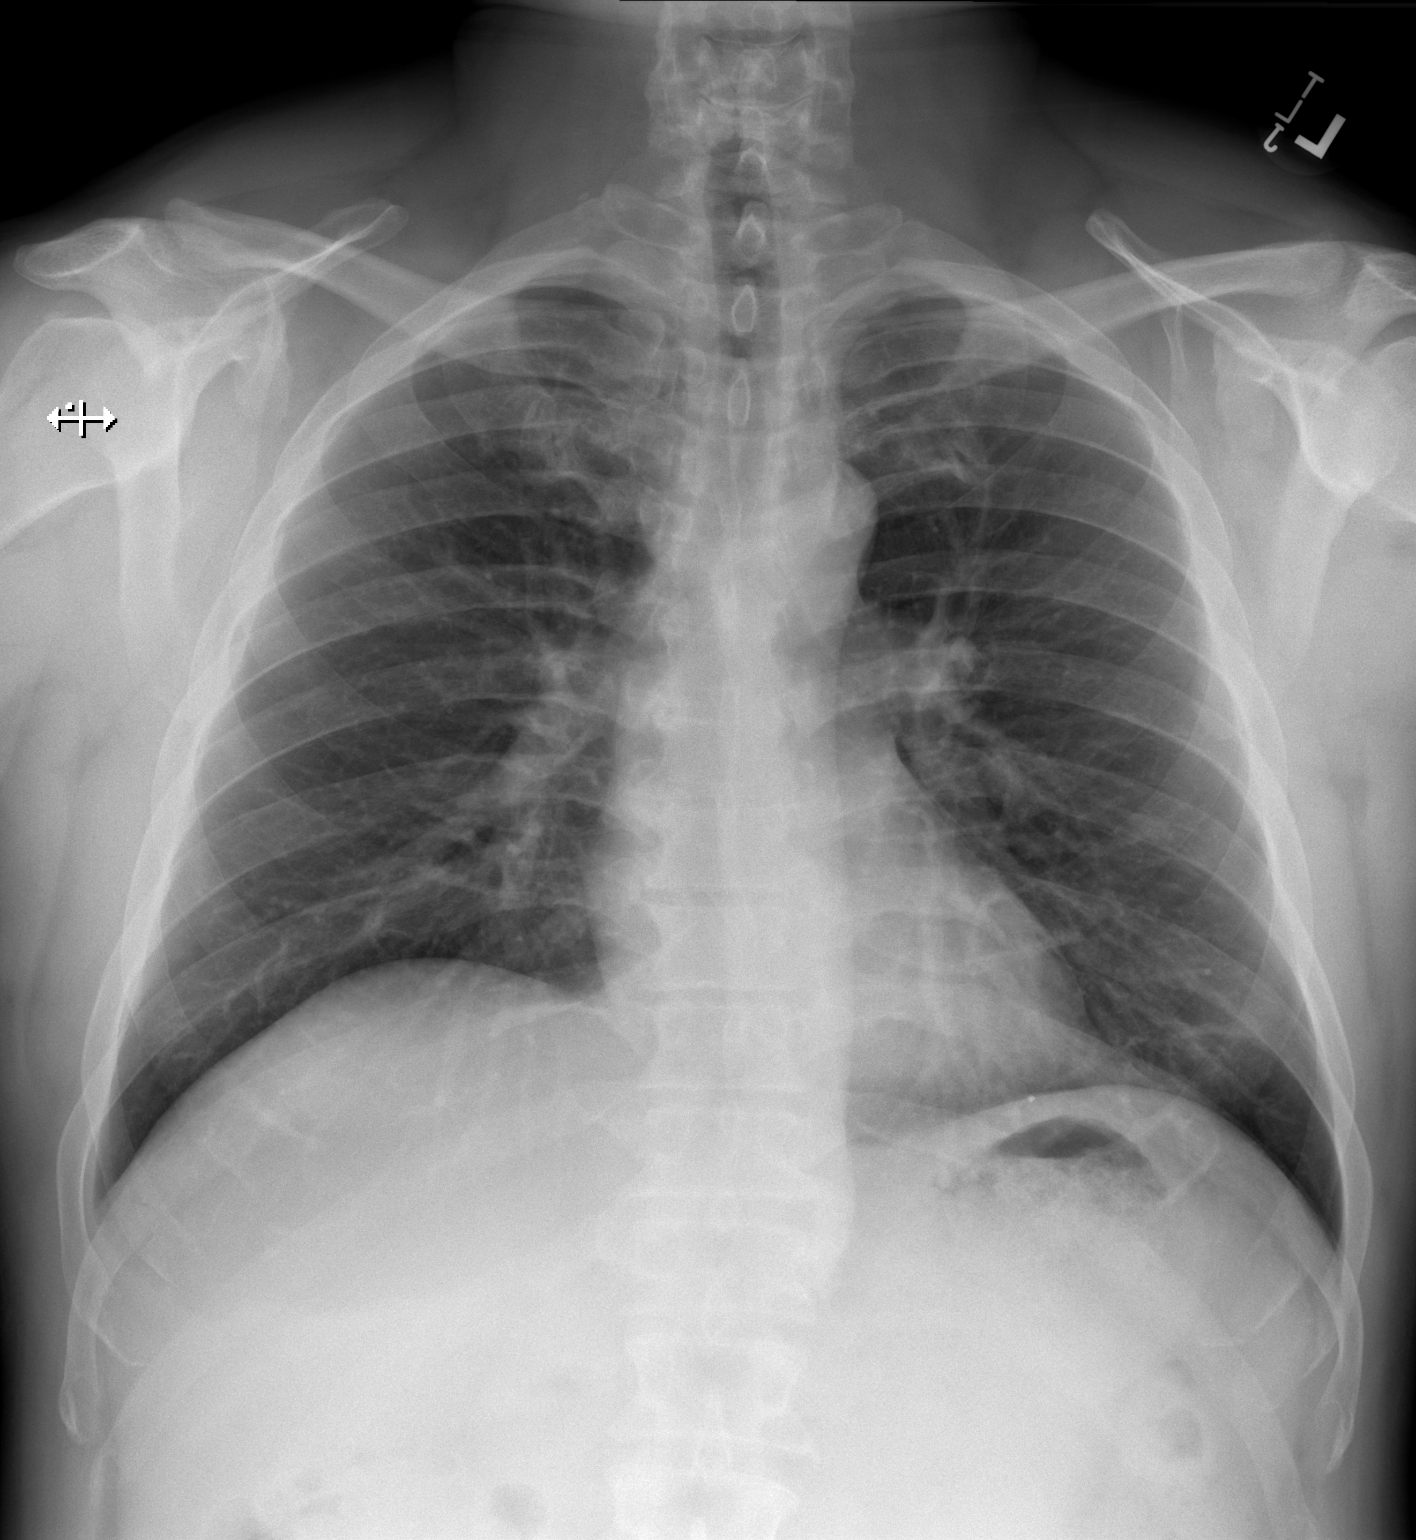

[w chest lat]
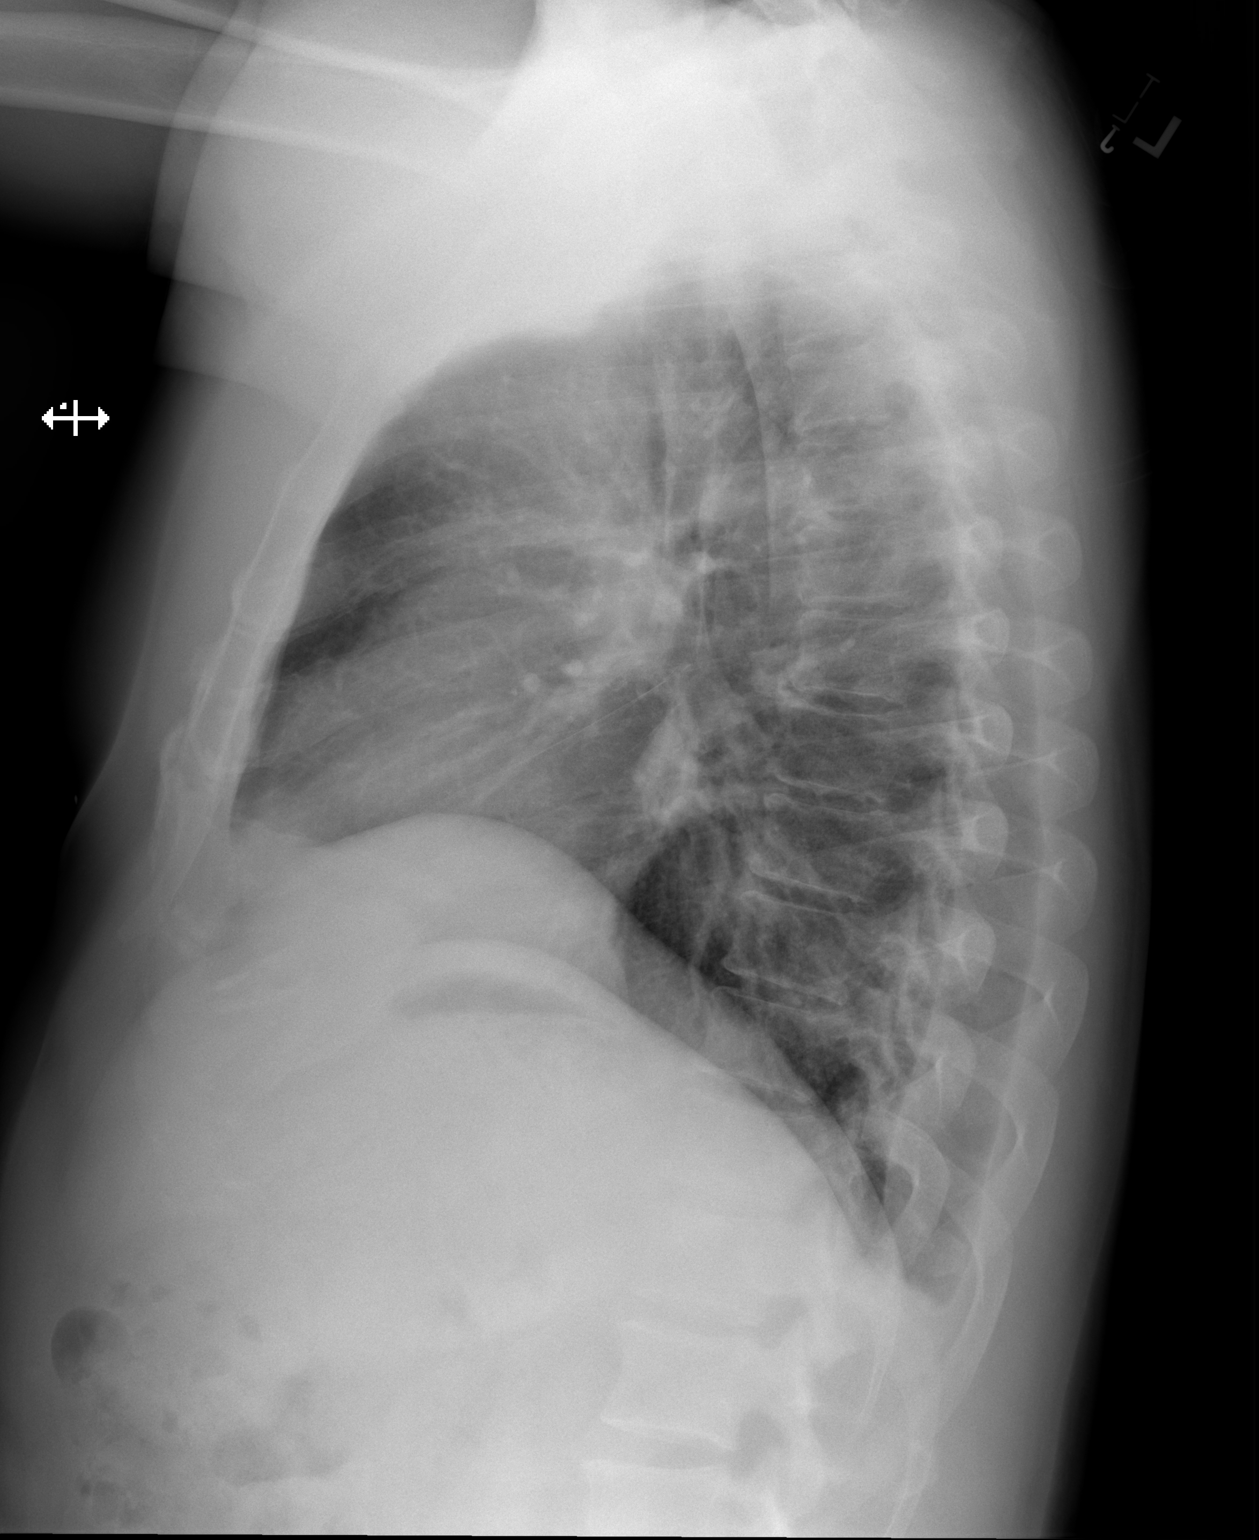

[2 of 2 positions shown; findings below may reference images not displayed]

FINDINGS: The heart size and mediastinal contours are within normal limits.
Both lungs are clear. No evidence of pneumothorax or hemothorax. The
visualized skeletal structures are unremarkable.
IMPRESSION: No active cardiopulmonary disease.

## 2023-05-11 ENCOUNTER — Emergency Department (HOSPITAL_COMMUNITY)
Admission: EM | Admit: 2023-05-11 | Discharge: 2023-05-12 | Disposition: A | Discharge status: Home / Self Care | Payer: No Typology Code available for payment source | Attending: Emergency Medicine | Admitting: Emergency Medicine

## 2023-05-11 ENCOUNTER — Emergency Department (HOSPITAL_COMMUNITY): Payer: No Typology Code available for payment source

## 2023-05-11 ENCOUNTER — Other Ambulatory Visit: Payer: Self-pay

## 2023-05-11 ENCOUNTER — Encounter (HOSPITAL_COMMUNITY): Payer: Self-pay

## 2023-05-11 DIAGNOSIS — J189 Pneumonia, unspecified organism: Secondary | ICD-10-CM

## 2023-05-11 DIAGNOSIS — Z1152 Encounter for screening for COVID-19: Secondary | ICD-10-CM | POA: Insufficient documentation

## 2023-05-11 DIAGNOSIS — J181 Lobar pneumonia, unspecified organism: Secondary | ICD-10-CM | POA: Insufficient documentation

## 2023-05-11 LAB — RESP PANEL BY RT-PCR (RSV, FLU A&B, COVID)  RVPGX2
Influenza A by PCR: NEGATIVE
Influenza B by PCR: NEGATIVE
Resp Syncytial Virus by PCR: NEGATIVE
SARS Coronavirus 2 by RT PCR: NEGATIVE

## 2023-05-11 MED ORDER — ALBUTEROL SULFATE HFA 108 (90 BASE) MCG/ACT IN AERS
2.0000 | INHALATION_SPRAY | RESPIRATORY_TRACT | Status: DC | PRN
  Administered 2023-05-12: 2 via RESPIRATORY_TRACT
  Filled 2023-05-11: qty 6.7

## 2023-05-11 MED ORDER — ACETAMINOPHEN 325 MG PO TABS
650.0000 mg | ORAL_TABLET | Freq: Once | ORAL | Status: AC | PRN
  Administered 2023-05-11: 650 mg via ORAL
  Filled 2023-05-11: qty 2

## 2023-05-11 NOTE — ED Triage Notes (Addendum)
Patient presents with general body aches and headache. States he took tylenol 4 hours ago. Temp in triage is 102.7, HR 123. Patient endorse small cough and sore throat. Denies being around anyone whose been sick. Denies N/V. No chest pain. States occasionally he feels like he is breathing really fast, but it goes away.

## 2023-05-12 ENCOUNTER — Emergency Department (HOSPITAL_COMMUNITY): Payer: No Typology Code available for payment source

## 2023-05-12 LAB — CBC WITH DIFFERENTIAL/PLATELET
Abs Immature Granulocytes: 0.03 10*3/uL (ref 0.00–0.07)
Basophils Absolute: 0 10*3/uL (ref 0.0–0.1)
Basophils Relative: 0 %
Eosinophils Absolute: 0.2 10*3/uL (ref 0.0–0.5)
Eosinophils Relative: 2 %
HCT: 44 % (ref 39.0–52.0)
Hemoglobin: 15.4 g/dL (ref 13.0–17.0)
Immature Granulocytes: 0 %
Lymphocytes Relative: 9 %
Lymphs Abs: 0.9 10*3/uL (ref 0.7–4.0)
MCH: 31.8 pg (ref 26.0–34.0)
MCHC: 35 g/dL (ref 30.0–36.0)
MCV: 90.9 fL (ref 80.0–100.0)
Monocytes Absolute: 0.6 10*3/uL (ref 0.1–1.0)
Monocytes Relative: 7 %
Neutro Abs: 7.3 10*3/uL (ref 1.7–7.7)
Neutrophils Relative %: 82 %
Platelets: 182 10*3/uL (ref 150–400)
RBC: 4.84 MIL/uL (ref 4.22–5.81)
RDW: 12.1 % (ref 11.5–15.5)
WBC: 9 10*3/uL (ref 4.0–10.5)
nRBC: 0 % (ref 0.0–0.2)

## 2023-05-12 LAB — BASIC METABOLIC PANEL
Anion gap: 14 (ref 5–15)
BUN: 15 mg/dL (ref 6–20)
CO2: 23 mmol/L (ref 22–32)
Calcium: 8.6 mg/dL — ABNORMAL LOW (ref 8.9–10.3)
Chloride: 97 mmol/L — ABNORMAL LOW (ref 98–111)
Creatinine, Ser: 1.19 mg/dL (ref 0.61–1.24)
GFR, Estimated: >60 mL/min (ref >60)
Glucose, Bld: 194 mg/dL — ABNORMAL HIGH (ref 70–99)
Potassium: 3.6 mmol/L (ref 3.5–5.1)
Sodium: 134 mmol/L — ABNORMAL LOW (ref 135–145)

## 2023-05-12 MED ORDER — LACTATED RINGERS IV BOLUS
1000.0000 mL | Freq: Once | INTRAVENOUS | Status: AC
  Administered 2023-05-12: 1000 mL via INTRAVENOUS

## 2023-05-12 MED ORDER — SODIUM CHLORIDE 0.9 % IV SOLN
100.0000 mg | Freq: Once | INTRAVENOUS | Status: AC
  Administered 2023-05-12: 100 mg via INTRAVENOUS
  Filled 2023-05-12: qty 100

## 2023-05-12 MED ORDER — IBUPROFEN 800 MG PO TABS
800.0000 mg | ORAL_TABLET | Freq: Once | ORAL | Status: AC
  Administered 2023-05-12: 800 mg via ORAL
  Filled 2023-05-12: qty 1

## 2023-05-12 MED ORDER — ACETAMINOPHEN 500 MG PO TABS
1000.0000 mg | ORAL_TABLET | Freq: Once | ORAL | Status: AC
  Administered 2023-05-12: 1000 mg via ORAL
  Filled 2023-05-12: qty 2

## 2023-05-12 MED ORDER — DOXYCYCLINE HYCLATE 100 MG PO CAPS: 100.0000 mg | ORAL_CAPSULE | Freq: Two times a day (BID) | ORAL | 0 refills | Status: AC

## 2023-05-12 NOTE — ED Provider Notes (Signed)
[place] EM[facility]ENC[facility] DEPARTMENT AT [facility] Provider Note   CSN: [id number] Arrival date & time: 05/11/23  2124     History {Add pertinent medical, surgical, social history, OB history to HPI:1} Chief Complaint  Patient presents with   Generalized Body Aches    Trevor Graves is a 47 y.o. male.  47 year old male without significant past medical history the presents ER today with bodyaches and headache is been progressively worsening over the last few days.  Recently has had a small cough and sore throat as well.  No sick contacts.  No recent travel this on the contrary.  Is occasionally dyspneic but not persistently so.  No lower extremity swelling.  No history of the same.        Home Medications Prior to Admission medications   Medication Sig Start Date End Date Taking? Authorizing Provider  ibuprofen (ADVIL) 200 MG tablet Take 200 mg by mouth every 6 (six) hours as needed for fever, headache or mild pain.   Yes [provider]      Allergies    Patient has no known allergies.    Review of Systems   Review of Systems  Physical Exam Updated Vital Signs BP 119/71   Pulse (!) 115   Temp (!) 100.8 F (38.2 C) (Oral)   Resp 19   Ht 5\' 8"  (1.727 m)   Wt 86.2 kg   SpO2 99%   BMI 28.89 kg/m  Physical Exam Vitals and nursing note reviewed.  Constitutional:      Appearance: He is well-developed.  HENT:     Head: Normocephalic and atraumatic.     Nose: No rhinorrhea.     Mouth/Throat:     Mouth: Mucous membranes are moist.  Eyes:     Pupils: Pupils are equal, round, and reactive to light.  Cardiovascular:     Rate and Rhythm: Tachycardia present.  Pulmonary:     Effort: Pulmonary effort is normal. Tachypnea present. No respiratory distress.  Abdominal:     General: There is no distension.  Musculoskeletal:        General: Normal range of motion.     Cervical back: Normal range of motion.  Skin:    General: Skin is warm and dry.   Neurological:     General: No focal deficit present.     Mental Status: He is alert.     ED Results / Procedures / Treatments   Labs (all labs ordered are listed, but only abnormal results are displayed) Labs Reviewed  BASIC METABOLIC PANEL - Abnormal; Notable for the following components:      Result Value   Sodium 134 (*)    Chloride 97 (*)    Glucose, Bld 194 (*)    Calcium 8.6 (*)    All other components within normal limits  RESP PANEL BY RT-PCR (RSV, FLU A&B, COVID)  [id number]  CBC WITH DIFFERENTIAL/PLATELET    EKG EKG Interpretation Date/Time:  Sunday May 11 2023 21:43:13 EDT Ventricular Rate:  115 PR Interval:  138 QRS Duration:  74 QT Interval:  310 QTC Calculation: 428 R Axis:   58  Text Interpretation: Sinus tachycardia Otherwise normal ECG When compared with ECG of 29-Jan-2017 01:38, PREVIOUS ECG IS PRESENT Confirmed by Marily Memos [id number] on 05/12/2023 6:06:54 AM  Radiology CT Chest Wo Contrast  Result Date: 05/12/2023 CLINICAL DATA:  Cough and sore throat. EXAM: CT CHEST WITHOUT CONTRAST TECHNIQUE: Multidetector CT imaging of the chest was performed following the  standard protocol without IV contrast. RADIATION DOSE REDUCTION: This exam was performed according to the departmental dose-optimization program which includes automated exposure control, adjustment of the mA and/or kV according to patient size and/or use of iterative reconstruction technique. COMPARISON:  None Available. FINDINGS: Cardiovascular: No significant vascular findings. Normal heart size. No pericardial effusion. Mediastinum/Nodes: No enlarged mediastinal or axillary lymph nodes. Thyroid gland, trachea, and esophagus demonstrate no significant findings. Lungs/Pleura: Marked severity patchy right upper lobe infiltrate is seen. No pleural effusion or pneumothorax is identified. Upper Abdomen: There is a small hiatal hernia. A 2 mm gallstone is seen within the dependent portion of a partially  contracted gallbladder. Musculoskeletal: No chest wall mass or suspicious bone lesions identified. IMPRESSION: 1. Marked severity right upper lobe infiltrate. 2. Small hiatal hernia. 3. Cholelithiasis. Electronically Signed   By: Aram Candela M.D.   On: 05/12/2023 01:37   DG Chest 2 View  Result Date: 05/11/2023 CLINICAL DATA:  Shortness of breath. EXAM: CHEST - 2 VIEW COMPARISON:  November 13, 2020 FINDINGS: The heart size and mediastinal contours are within normal limits. Patchy, mild to moderate severity infiltrate is seen within the right upper lobe. No pleural effusion or pneumothorax is identified. The visualized skeletal structures are unremarkable. IMPRESSION: Right upper lobe infiltrate. Electronically Signed   By: Aram Candela M.D.   On: 05/11/2023 22:08    Procedures Procedures    Medications Ordered in ED Medications  albuterol (VENTOLIN HFA) 108 (90 Base) MCG/ACT inhaler 2 puff (2 puffs Inhalation Given 05/12/23 0117)  acetaminophen (TYLENOL) tablet 650 mg (650 mg Oral Given 05/11/23 2139)  lactated ringers bolus 1,000 mL (0 mLs Intravenous Stopped 05/12/23 0442)  doxycycline (VIBRAMYCIN) 100 mg in sodium chloride 0.9 % 250 mL IVPB (0 mg Intravenous Stopped 05/12/23 0358)  ibuprofen (ADVIL) tablet 800 mg (800 mg Oral Given 05/12/23 0328)  lactated ringers bolus 1,000 mL (0 mLs Intravenous Stopped 05/12/23 0442)  acetaminophen (TYLENOL) tablet 1,000 mg (1,000 mg Oral Given 05/12/23 0552)  lactated ringers bolus 1,000 mL (1,000 mLs Intravenous New Bag/Given 05/12/23 1308)    ED Course/ Medical Decision Making/ A&P                                 Medical Decision Making Amount and/or Complexity of Data Reviewed Labs: ordered. Radiology: ordered.  Risk OTC drugs. Prescription drug management.   Patient with tachypnea tachycardia and chest x-ray concerning for possible pneumonia.  Fluids and antibiotics started.  I suspect that most of his tachypnea and tachycardia is  related to his elevated temperature.  COVID and flu are negative however I suspect he probably did have some viral syndrome and now has a postviral pneumonia.  I reviewed his chest x-ray he does have a opacity in the right upper lobe when I compared to his chest x-ray from 2022 does appear that he has some opacity at that time as well but is significantly worse now.  Obviously this would concern me for some type of malignancy or other mass so did a CT just to reassure her that it was pneumonia and not a postobstructive pneumonia or some other abnormality.  This was consistent with commune acquired pneumonia.  Patient's been started on doxycycline.  When his fevers improved heart rates improved however his temperature spikes back up pretty easily.  Couple liters of fluid to finally get him to urinate so I suspect that he had some component of  dehydration as well.  Will reevaluate after Tylenol that time to take effect.not hypoxic or in any resp distress to suggest need for any respiratory support.    Final Clinical Impression(s) / ED Diagnoses Final diagnoses:  None    Rx / DC Orders ED Discharge Orders     None
